# Patient Record
Sex: Male | Born: 1945 | Race: White | Hispanic: No | Marital: Married | Smoking: Former smoker
Health system: Southern US, Community
[De-identification: ages and names within clinical notes are randomized; demographics above are authoritative.]

## PROBLEM LIST (undated history)

## (undated) DIAGNOSIS — I10 Essential (primary) hypertension: Secondary | ICD-10-CM

## (undated) DIAGNOSIS — C73 Malignant neoplasm of thyroid gland: Secondary | ICD-10-CM

## (undated) DIAGNOSIS — E119 Type 2 diabetes mellitus without complications: Secondary | ICD-10-CM

## (undated) HISTORY — PX: THYROIDECTOMY: SHX17

## (undated) HISTORY — PX: HERNIA REPAIR: SHX51

---

## 2008-08-07 ENCOUNTER — Ambulatory Visit: Payer: Self-pay | Admitting: Internal Medicine

## 2008-08-24 ENCOUNTER — Ambulatory Visit: Payer: Self-pay | Admitting: Internal Medicine

## 2009-09-12 ENCOUNTER — Ambulatory Visit: Payer: Self-pay

## 2009-10-04 ENCOUNTER — Ambulatory Visit: Payer: Self-pay | Admitting: Unknown Physician Specialty

## 2009-10-08 ENCOUNTER — Ambulatory Visit: Payer: Self-pay | Admitting: Unknown Physician Specialty

## 2010-05-30 ENCOUNTER — Ambulatory Visit: Payer: Self-pay | Admitting: Internal Medicine

## 2010-06-11 ENCOUNTER — Ambulatory Visit: Payer: Self-pay | Admitting: Surgery

## 2010-06-18 ENCOUNTER — Ambulatory Visit: Payer: Self-pay | Admitting: Surgery

## 2010-07-02 ENCOUNTER — Ambulatory Visit: Payer: Self-pay | Admitting: Surgery

## 2010-07-08 LAB — PATHOLOGY REPORT

## 2010-07-12 ENCOUNTER — Ambulatory Visit: Payer: Self-pay | Admitting: Oncology

## 2010-07-19 ENCOUNTER — Ambulatory Visit: Payer: Self-pay | Admitting: Oncology

## 2010-08-04 ENCOUNTER — Ambulatory Visit: Payer: Self-pay | Admitting: Oncology

## 2010-09-04 ENCOUNTER — Ambulatory Visit: Payer: Self-pay | Admitting: Oncology

## 2010-10-03 ENCOUNTER — Ambulatory Visit: Payer: Self-pay | Admitting: Oncology

## 2010-10-03 ENCOUNTER — Ambulatory Visit: Payer: Self-pay

## 2010-11-07 ENCOUNTER — Ambulatory Visit: Payer: Self-pay | Admitting: Oncology

## 2010-12-03 ENCOUNTER — Ambulatory Visit: Payer: Self-pay | Admitting: Oncology

## 2011-01-09 ENCOUNTER — Ambulatory Visit: Payer: Self-pay | Admitting: Unknown Physician Specialty

## 2011-02-17 ENCOUNTER — Ambulatory Visit: Payer: Self-pay | Admitting: Unknown Physician Specialty

## 2011-02-20 LAB — PATHOLOGY REPORT

## 2011-09-15 ENCOUNTER — Ambulatory Visit: Payer: Self-pay | Admitting: Oncology

## 2011-09-15 LAB — CBC CANCER CENTER
Basophil %: 0.2 %
Eosinophil #: 0.1 x10 3/mm (ref 0.0–0.7)
Eosinophil %: 0.9 %
HCT: 42.9 % (ref 40.0–52.0)
HGB: 14.8 g/dL (ref 13.0–18.0)
MCHC: 34.5 g/dL (ref 32.0–36.0)
MCV: 90 fL (ref 80–100)
Monocyte %: 8.9 %
Platelet: 192 x10 3/mm (ref 150–440)
RBC: 4.78 10*6/uL (ref 4.40–5.90)
WBC: 6.8 x10 3/mm (ref 3.8–10.6)

## 2011-09-15 LAB — COMPREHENSIVE METABOLIC PANEL
Anion Gap: 10 (ref 7–16)
Chloride: 99 mmol/L (ref 98–107)
Co2: 29 mmol/L (ref 21–32)
EGFR (African American): >60
EGFR (Non-African Amer.): >60
Potassium: 4.2 mmol/L (ref 3.5–5.1)
SGOT(AST): 5 U/L — ABNORMAL LOW (ref 15–37)
Sodium: 138 mmol/L (ref 136–145)
Total Protein: 7.1 g/dL (ref 6.4–8.2)

## 2011-09-15 LAB — TSH: Thyroid Stimulating Horm: 15.1 u[IU]/mL — ABNORMAL HIGH

## 2011-10-03 ENCOUNTER — Ambulatory Visit: Payer: Self-pay | Admitting: Oncology

## 2011-11-19 ENCOUNTER — Ambulatory Visit: Payer: Self-pay

## 2011-11-19 LAB — DOT URINE DIP
Blood: NEGATIVE
Protein: NEGATIVE
Specific Gravity: 1.025 (ref 1.003–1.030)

## 2012-03-16 ENCOUNTER — Ambulatory Visit: Payer: Self-pay | Admitting: Oncology

## 2012-09-26 ENCOUNTER — Emergency Department: Payer: Self-pay | Admitting: Emergency Medicine

## 2012-09-26 LAB — CBC
MCH: 30 pg (ref 26.0–34.0)
MCV: 88 fL (ref 80–100)
RBC: 5.09 10*6/uL (ref 4.40–5.90)
RDW: 13.2 % (ref 11.5–14.5)
WBC: 7.5 10*3/uL (ref 3.8–10.6)

## 2012-09-26 LAB — COMPREHENSIVE METABOLIC PANEL
Albumin: 3.6 g/dL (ref 3.4–5.0)
Anion Gap: 9 (ref 7–16)
BUN: 18 mg/dL (ref 7–18)
Chloride: 106 mmol/L (ref 98–107)
Co2: 23 mmol/L (ref 21–32)
Creatinine: 0.96 mg/dL (ref 0.60–1.30)
EGFR (African American): >60
Potassium: 3.4 mmol/L — ABNORMAL LOW (ref 3.5–5.1)
SGOT(AST): 20 U/L (ref 15–37)
Sodium: 138 mmol/L (ref 136–145)

## 2012-09-26 LAB — URINALYSIS, COMPLETE
Bacteria: NONE SEEN
Glucose,UR: >=500 mg/dL (ref 0–75)
Nitrite: NEGATIVE
Protein: NEGATIVE
Specific Gravity: 1.025 (ref 1.003–1.030)
Squamous Epithelial: <1
WBC UR: 1 /HPF (ref 0–5)

## 2012-09-26 LAB — CK TOTAL AND CKMB (NOT AT ARMC)
CK, Total: 63 U/L (ref 35–232)
CK-MB: 0.6 ng/mL (ref 0.5–3.6)

## 2012-09-26 LAB — TROPONIN I: Troponin-I: <0.02 ng/mL

## 2012-10-02 LAB — CULTURE, BLOOD (SINGLE)

## 2014-09-03 ENCOUNTER — Ambulatory Visit: Payer: Self-pay | Admitting: Emergency Medicine

## 2014-09-03 LAB — DOT URINE DIP
BLOOD: NEGATIVE
GLUCOSE, UR: NEGATIVE
PROTEIN: NEGATIVE
Specific Gravity: 1.025 (ref 1.000–1.030)

## 2015-08-21 DIAGNOSIS — J019 Acute sinusitis, unspecified: Secondary | ICD-10-CM | POA: Diagnosis not present

## 2015-09-03 DIAGNOSIS — E039 Hypothyroidism, unspecified: Secondary | ICD-10-CM | POA: Diagnosis not present

## 2015-09-03 DIAGNOSIS — E119 Type 2 diabetes mellitus without complications: Secondary | ICD-10-CM | POA: Diagnosis not present

## 2015-09-03 DIAGNOSIS — E785 Hyperlipidemia, unspecified: Secondary | ICD-10-CM | POA: Diagnosis not present

## 2015-09-03 DIAGNOSIS — I10 Essential (primary) hypertension: Secondary | ICD-10-CM | POA: Diagnosis not present

## 2015-09-04 ENCOUNTER — Ambulatory Visit
Admission: EM | Admit: 2015-09-04 | Discharge: 2015-09-04 | Disposition: A | Discharge status: Home / Self Care | Payer: PRIVATE HEALTH INSURANCE | Attending: Family Medicine | Admitting: Family Medicine

## 2015-09-04 ENCOUNTER — Encounter: Payer: Self-pay | Admitting: Emergency Medicine

## 2015-09-04 DIAGNOSIS — Z024 Encounter for examination for driving license: Secondary | ICD-10-CM

## 2015-09-04 DIAGNOSIS — E039 Hypothyroidism, unspecified: Secondary | ICD-10-CM | POA: Diagnosis not present

## 2015-09-04 DIAGNOSIS — I10 Essential (primary) hypertension: Secondary | ICD-10-CM | POA: Diagnosis not present

## 2015-09-04 DIAGNOSIS — E785 Hyperlipidemia, unspecified: Secondary | ICD-10-CM | POA: Diagnosis not present

## 2015-09-04 DIAGNOSIS — Z029 Encounter for administrative examinations, unspecified: Secondary | ICD-10-CM

## 2015-09-04 DIAGNOSIS — E119 Type 2 diabetes mellitus without complications: Secondary | ICD-10-CM | POA: Diagnosis not present

## 2015-09-04 HISTORY — DX: Type 2 diabetes mellitus without complications: E11.9

## 2015-09-04 HISTORY — DX: Malignant neoplasm of thyroid gland: C73

## 2015-09-04 HISTORY — DX: Essential (primary) hypertension: I10

## 2015-09-04 LAB — DEPT OF TRANSP DIPSTICK, URINE (ARMC ONLY)
Glucose, UA: NEGATIVE mg/dL
Hgb urine dipstick: NEGATIVE
Protein, ur: NEGATIVE mg/dL
Specific Gravity, Urine: 1.02 (ref 1.005–1.030)

## 2015-09-04 NOTE — ED Notes (Signed)
Patient here for DOT Physical.  

## 2015-09-04 NOTE — ED Provider Notes (Signed)
CSN: [id number]     Arrival date & time 09/04/15  1243 History   First MD Initiated Contact with Patient 09/04/15 1419     Chief Complaint  Patient presents with  . DOT Physical    (Consider location/radiation/quality/duration/timing/severity/associated sxs/prior Treatment) HPI   Patient is here for a DOT physical. He has a history of hypertension that is under control and diabetes mellitus that has been slightly out of control with an A1c of 8 but he has been on a cruise with unlimited eating been on prednisone which may possibly driven his glucose higher. Otherwise he is in good shape past surgical history includes a thyroidectomy for cancer currently on replacement therapy. And he had an inguinal herniorrhaphy  Past Medical History  Diagnosis Date  . Hypertension   . Diabetes mellitus without complication ([place])   . Thyroid cancer [facility])    Past Surgical History  Procedure Laterality Date  . Thyroidectomy    . Hernia repair     History reviewed. No pertinent family history. Social History  Substance Use Topics  . Smoking status: Former Research scientist (life sciences)  . Smokeless tobacco: None  . Alcohol Use: No    Review of Systems  All other systems reviewed and are negative.   Allergies  Review of patient's allergies indicates no known allergies.  Home Medications   Prior to Admission medications   Medication Sig Start Date End Date Taking? Authorizing Provider  amLODipine (NORVASC) 5 MG tablet Take 5 mg by mouth daily.   Yes Historical Provider, MD  atorvastatin (LIPITOR) 10 MG tablet Take 10 mg by mouth daily.   Yes Historical Provider, MD  levothyroxine (SYNTHROID, LEVOTHROID) 125 MCG tablet Take 125 mcg by mouth daily before breakfast.   Yes Historical Provider, MD  metFORMIN (GLUCOPHAGE) 500 MG tablet Take 500 mg by mouth 2 (two) times daily with a meal.   Yes Historical Provider, MD   Meds Ordered and Administered this Visit  Medications - No data to display  BP 128/78 mmHg  Pulse  95  Temp(Src) 97.4 F (36.3 C) (Tympanic)  Resp 16  Ht 5\' 8"  (1.727 m)  Wt 232 lb 8 oz (105.461 kg)  BMI 35.36 kg/m2  SpO2 98% No data found.   Physical Exam  Constitutional:  Refer to the DOT physical  Nursing note and vitals reviewed.   ED Course  Procedures (including critical care time)  Labs Review Labs Reviewed  DEPT OF TRANSP DIPSTICK, URINE([facility] ONLY)    Imaging Review No results found.   Visual Acuity Review  Right Eye Distance: 20/20 uncorrected Left Eye Distance: 20/20 uncorrected Bilateral Distance: 20/20 uncorrected  Right Eye Near:   Left Eye Near:    Bilateral Near:         MDM   1. Driver's permit physical examination        Lorin Picket, PA-C 09/04/15 1504

## 2015-10-22 DIAGNOSIS — K529 Noninfective gastroenteritis and colitis, unspecified: Secondary | ICD-10-CM | POA: Diagnosis not present

## 2015-10-22 DIAGNOSIS — K2990 Gastroduodenitis, unspecified, without bleeding: Secondary | ICD-10-CM | POA: Diagnosis not present

## 2015-12-04 DIAGNOSIS — R1084 Generalized abdominal pain: Secondary | ICD-10-CM | POA: Diagnosis not present

## 2015-12-04 DIAGNOSIS — R112 Nausea with vomiting, unspecified: Secondary | ICD-10-CM | POA: Diagnosis not present

## 2015-12-04 DIAGNOSIS — R5383 Other fatigue: Secondary | ICD-10-CM | POA: Diagnosis not present

## 2015-12-04 DIAGNOSIS — R197 Diarrhea, unspecified: Secondary | ICD-10-CM | POA: Diagnosis not present

## 2015-12-04 DIAGNOSIS — R10814 Left lower quadrant abdominal tenderness: Secondary | ICD-10-CM | POA: Diagnosis not present

## 2015-12-04 DIAGNOSIS — R61 Generalized hyperhidrosis: Secondary | ICD-10-CM | POA: Diagnosis not present

## 2015-12-11 ENCOUNTER — Other Ambulatory Visit: Payer: Self-pay | Admitting: Nurse Practitioner

## 2015-12-11 DIAGNOSIS — R1084 Generalized abdominal pain: Secondary | ICD-10-CM

## 2015-12-11 DIAGNOSIS — R1013 Epigastric pain: Secondary | ICD-10-CM | POA: Diagnosis not present

## 2015-12-11 DIAGNOSIS — R0602 Shortness of breath: Secondary | ICD-10-CM | POA: Diagnosis not present

## 2015-12-11 DIAGNOSIS — R61 Generalized hyperhidrosis: Secondary | ICD-10-CM | POA: Diagnosis not present

## 2015-12-17 ENCOUNTER — Ambulatory Visit: Payer: 59

## 2015-12-18 ENCOUNTER — Ambulatory Visit
Admission: RE | Admit: 2015-12-18 | Discharge: 2015-12-18 | Disposition: A | Discharge status: Home / Self Care | Payer: 59 | Source: Ambulatory Visit | Attending: Nurse Practitioner | Admitting: Nurse Practitioner

## 2015-12-18 DIAGNOSIS — R1084 Generalized abdominal pain: Secondary | ICD-10-CM | POA: Diagnosis not present

## 2015-12-18 DIAGNOSIS — R634 Abnormal weight loss: Secondary | ICD-10-CM | POA: Diagnosis not present

## 2015-12-18 DIAGNOSIS — K4020 Bilateral inguinal hernia, without obstruction or gangrene, not specified as recurrent: Secondary | ICD-10-CM | POA: Diagnosis not present

## 2015-12-18 DIAGNOSIS — K5730 Diverticulosis of large intestine without perforation or abscess without bleeding: Secondary | ICD-10-CM | POA: Insufficient documentation

## 2015-12-18 MED ORDER — IOPAMIDOL (ISOVUE-300) INJECTION 61%: 100.0000 mL | Freq: Once | INTRAVENOUS | Status: DC | PRN

## 2015-12-21 DIAGNOSIS — R1011 Right upper quadrant pain: Secondary | ICD-10-CM | POA: Diagnosis not present

## 2015-12-21 DIAGNOSIS — R42 Dizziness and giddiness: Secondary | ICD-10-CM | POA: Diagnosis not present

## 2015-12-21 DIAGNOSIS — Z86010 Personal history of colonic polyps: Secondary | ICD-10-CM | POA: Diagnosis not present

## 2015-12-21 DIAGNOSIS — N3289 Other specified disorders of bladder: Secondary | ICD-10-CM | POA: Diagnosis not present

## 2016-01-01 DIAGNOSIS — R531 Weakness: Secondary | ICD-10-CM | POA: Diagnosis not present

## 2016-01-01 DIAGNOSIS — R634 Abnormal weight loss: Secondary | ICD-10-CM | POA: Diagnosis not present

## 2016-01-07 DIAGNOSIS — R42 Dizziness and giddiness: Secondary | ICD-10-CM | POA: Diagnosis not present

## 2016-01-18 DIAGNOSIS — I491 Atrial premature depolarization: Secondary | ICD-10-CM | POA: Diagnosis not present

## 2016-02-04 DIAGNOSIS — R42 Dizziness and giddiness: Secondary | ICD-10-CM | POA: Diagnosis not present

## 2016-02-04 DIAGNOSIS — I10 Essential (primary) hypertension: Secondary | ICD-10-CM | POA: Diagnosis not present

## 2016-02-04 DIAGNOSIS — E039 Hypothyroidism, unspecified: Secondary | ICD-10-CM | POA: Diagnosis not present

## 2016-02-04 DIAGNOSIS — E785 Hyperlipidemia, unspecified: Secondary | ICD-10-CM | POA: Diagnosis not present

## 2016-02-09 DIAGNOSIS — R531 Weakness: Secondary | ICD-10-CM | POA: Diagnosis not present

## 2016-02-09 DIAGNOSIS — C711 Malignant neoplasm of frontal lobe: Secondary | ICD-10-CM | POA: Diagnosis not present

## 2016-02-09 DIAGNOSIS — Z86010 Personal history of colonic polyps: Secondary | ICD-10-CM | POA: Diagnosis not present

## 2016-02-09 DIAGNOSIS — R42 Dizziness and giddiness: Secondary | ICD-10-CM | POA: Diagnosis not present

## 2016-02-09 DIAGNOSIS — E785 Hyperlipidemia, unspecified: Secondary | ICD-10-CM | POA: Diagnosis not present

## 2016-02-09 DIAGNOSIS — R29898 Other symptoms and signs involving the musculoskeletal system: Secondary | ICD-10-CM | POA: Diagnosis not present

## 2016-02-09 DIAGNOSIS — I10 Essential (primary) hypertension: Secondary | ICD-10-CM | POA: Diagnosis not present

## 2016-02-09 DIAGNOSIS — Z85850 Personal history of malignant neoplasm of thyroid: Secondary | ICD-10-CM | POA: Diagnosis not present

## 2016-02-09 DIAGNOSIS — Z87891 Personal history of nicotine dependence: Secondary | ICD-10-CM | POA: Diagnosis not present

## 2016-02-09 DIAGNOSIS — G939 Disorder of brain, unspecified: Secondary | ICD-10-CM | POA: Diagnosis not present

## 2016-02-09 DIAGNOSIS — R51 Headache: Secondary | ICD-10-CM | POA: Diagnosis not present

## 2016-02-09 DIAGNOSIS — K219 Gastro-esophageal reflux disease without esophagitis: Secondary | ICD-10-CM | POA: Diagnosis not present

## 2016-02-09 DIAGNOSIS — D496 Neoplasm of unspecified behavior of brain: Secondary | ICD-10-CM | POA: Diagnosis not present

## 2016-02-09 DIAGNOSIS — R197 Diarrhea, unspecified: Secondary | ICD-10-CM | POA: Diagnosis not present

## 2016-02-09 DIAGNOSIS — C718 Malignant neoplasm of overlapping sites of brain: Secondary | ICD-10-CM | POA: Diagnosis not present

## 2016-02-09 DIAGNOSIS — G9389 Other specified disorders of brain: Secondary | ICD-10-CM | POA: Diagnosis not present

## 2016-02-09 DIAGNOSIS — E119 Type 2 diabetes mellitus without complications: Secondary | ICD-10-CM | POA: Diagnosis not present

## 2016-02-09 DIAGNOSIS — C729 Malignant neoplasm of central nervous system, unspecified: Secondary | ICD-10-CM | POA: Diagnosis not present

## 2016-02-15 ENCOUNTER — Encounter: Admission: RE | Payer: Self-pay | Source: Ambulatory Visit

## 2016-02-15 ENCOUNTER — Ambulatory Visit: Admission: RE | Admit: 2016-02-15 | Payer: 59 | Source: Ambulatory Visit | Admitting: Unknown Physician Specialty

## 2016-02-15 DIAGNOSIS — D496 Neoplasm of unspecified behavior of brain: Secondary | ICD-10-CM | POA: Diagnosis not present

## 2016-02-15 DIAGNOSIS — C712 Malignant neoplasm of temporal lobe: Secondary | ICD-10-CM | POA: Diagnosis not present

## 2016-02-15 SURGERY — COLONOSCOPY WITH PROPOFOL
Anesthesia: General

## 2016-02-18 DIAGNOSIS — I10 Essential (primary) hypertension: Secondary | ICD-10-CM | POA: Diagnosis not present

## 2016-02-18 DIAGNOSIS — Z79899 Other long term (current) drug therapy: Secondary | ICD-10-CM | POA: Diagnosis not present

## 2016-02-18 DIAGNOSIS — Z87891 Personal history of nicotine dependence: Secondary | ICD-10-CM | POA: Diagnosis not present

## 2016-02-18 DIAGNOSIS — Z7984 Long term (current) use of oral hypoglycemic drugs: Secondary | ICD-10-CM | POA: Diagnosis not present

## 2016-02-18 DIAGNOSIS — E119 Type 2 diabetes mellitus without complications: Secondary | ICD-10-CM | POA: Diagnosis not present

## 2016-02-18 DIAGNOSIS — C718 Malignant neoplasm of overlapping sites of brain: Secondary | ICD-10-CM | POA: Diagnosis not present

## 2016-02-18 DIAGNOSIS — Z885 Allergy status to narcotic agent status: Secondary | ICD-10-CM | POA: Diagnosis not present

## 2016-02-18 DIAGNOSIS — Z5112 Encounter for antineoplastic immunotherapy: Secondary | ICD-10-CM | POA: Diagnosis not present

## 2016-02-18 DIAGNOSIS — C719 Malignant neoplasm of brain, unspecified: Secondary | ICD-10-CM | POA: Diagnosis not present

## 2016-02-18 DIAGNOSIS — Z85850 Personal history of malignant neoplasm of thyroid: Secondary | ICD-10-CM | POA: Diagnosis not present

## 2016-02-19 DIAGNOSIS — C719 Malignant neoplasm of brain, unspecified: Secondary | ICD-10-CM | POA: Diagnosis not present

## 2016-02-22 DIAGNOSIS — C719 Malignant neoplasm of brain, unspecified: Secondary | ICD-10-CM | POA: Diagnosis not present

## 2016-02-26 DIAGNOSIS — C719 Malignant neoplasm of brain, unspecified: Secondary | ICD-10-CM | POA: Diagnosis not present

## 2016-02-27 DIAGNOSIS — C719 Malignant neoplasm of brain, unspecified: Secondary | ICD-10-CM | POA: Diagnosis not present

## 2016-02-28 DIAGNOSIS — C719 Malignant neoplasm of brain, unspecified: Secondary | ICD-10-CM | POA: Diagnosis not present

## 2016-02-29 DIAGNOSIS — C719 Malignant neoplasm of brain, unspecified: Secondary | ICD-10-CM | POA: Diagnosis not present

## 2016-03-03 DIAGNOSIS — C719 Malignant neoplasm of brain, unspecified: Secondary | ICD-10-CM | POA: Diagnosis not present

## 2016-03-04 DIAGNOSIS — C719 Malignant neoplasm of brain, unspecified: Secondary | ICD-10-CM | POA: Diagnosis not present

## 2016-03-05 DIAGNOSIS — C719 Malignant neoplasm of brain, unspecified: Secondary | ICD-10-CM | POA: Diagnosis not present

## 2016-03-06 DIAGNOSIS — C712 Malignant neoplasm of temporal lobe: Secondary | ICD-10-CM | POA: Diagnosis not present

## 2016-03-06 DIAGNOSIS — Z79899 Other long term (current) drug therapy: Secondary | ICD-10-CM | POA: Diagnosis not present

## 2016-03-06 DIAGNOSIS — C719 Malignant neoplasm of brain, unspecified: Secondary | ICD-10-CM | POA: Diagnosis not present

## 2016-03-06 DIAGNOSIS — E1165 Type 2 diabetes mellitus with hyperglycemia: Secondary | ICD-10-CM | POA: Diagnosis not present

## 2016-03-06 DIAGNOSIS — Z7984 Long term (current) use of oral hypoglycemic drugs: Secondary | ICD-10-CM | POA: Diagnosis not present

## 2016-03-06 DIAGNOSIS — Z87891 Personal history of nicotine dependence: Secondary | ICD-10-CM | POA: Diagnosis not present

## 2016-03-06 DIAGNOSIS — E119 Type 2 diabetes mellitus without complications: Secondary | ICD-10-CM | POA: Diagnosis not present

## 2016-03-06 DIAGNOSIS — R7309 Other abnormal glucose: Secondary | ICD-10-CM | POA: Diagnosis not present

## 2016-03-06 DIAGNOSIS — Z5112 Encounter for antineoplastic immunotherapy: Secondary | ICD-10-CM | POA: Diagnosis not present

## 2016-03-06 DIAGNOSIS — C718 Malignant neoplasm of overlapping sites of brain: Secondary | ICD-10-CM | POA: Diagnosis not present

## 2016-03-07 DIAGNOSIS — C719 Malignant neoplasm of brain, unspecified: Secondary | ICD-10-CM | POA: Diagnosis not present

## 2016-03-10 DIAGNOSIS — C719 Malignant neoplasm of brain, unspecified: Secondary | ICD-10-CM | POA: Diagnosis not present

## 2016-03-11 DIAGNOSIS — C719 Malignant neoplasm of brain, unspecified: Secondary | ICD-10-CM | POA: Diagnosis not present

## 2016-03-12 DIAGNOSIS — C719 Malignant neoplasm of brain, unspecified: Secondary | ICD-10-CM | POA: Diagnosis not present

## 2016-03-13 DIAGNOSIS — C719 Malignant neoplasm of brain, unspecified: Secondary | ICD-10-CM | POA: Diagnosis not present

## 2016-03-14 DIAGNOSIS — C719 Malignant neoplasm of brain, unspecified: Secondary | ICD-10-CM | POA: Diagnosis not present

## 2016-03-17 DIAGNOSIS — C719 Malignant neoplasm of brain, unspecified: Secondary | ICD-10-CM | POA: Diagnosis not present

## 2016-03-18 DIAGNOSIS — C719 Malignant neoplasm of brain, unspecified: Secondary | ICD-10-CM | POA: Diagnosis not present

## 2016-03-19 DIAGNOSIS — E1165 Type 2 diabetes mellitus with hyperglycemia: Secondary | ICD-10-CM | POA: Diagnosis not present

## 2016-03-19 DIAGNOSIS — Z5181 Encounter for therapeutic drug level monitoring: Secondary | ICD-10-CM | POA: Diagnosis not present

## 2016-03-19 DIAGNOSIS — C719 Malignant neoplasm of brain, unspecified: Secondary | ICD-10-CM | POA: Diagnosis not present

## 2016-03-19 DIAGNOSIS — Z7984 Long term (current) use of oral hypoglycemic drugs: Secondary | ICD-10-CM | POA: Diagnosis not present

## 2016-03-19 DIAGNOSIS — Z5111 Encounter for antineoplastic chemotherapy: Secondary | ICD-10-CM | POA: Diagnosis not present

## 2016-03-19 DIAGNOSIS — C718 Malignant neoplasm of overlapping sites of brain: Secondary | ICD-10-CM | POA: Diagnosis not present

## 2016-03-19 DIAGNOSIS — R81 Glycosuria: Secondary | ICD-10-CM | POA: Diagnosis not present

## 2016-03-19 DIAGNOSIS — C712 Malignant neoplasm of temporal lobe: Secondary | ICD-10-CM | POA: Diagnosis not present

## 2016-03-19 DIAGNOSIS — R531 Weakness: Secondary | ICD-10-CM | POA: Diagnosis not present

## 2016-03-24 DIAGNOSIS — C719 Malignant neoplasm of brain, unspecified: Secondary | ICD-10-CM | POA: Diagnosis not present

## 2016-03-25 DIAGNOSIS — C719 Malignant neoplasm of brain, unspecified: Secondary | ICD-10-CM | POA: Diagnosis not present

## 2016-03-26 DIAGNOSIS — C719 Malignant neoplasm of brain, unspecified: Secondary | ICD-10-CM | POA: Diagnosis not present

## 2016-03-27 DIAGNOSIS — C719 Malignant neoplasm of brain, unspecified: Secondary | ICD-10-CM | POA: Diagnosis not present

## 2016-03-28 DIAGNOSIS — C719 Malignant neoplasm of brain, unspecified: Secondary | ICD-10-CM | POA: Diagnosis not present

## 2016-03-28 DIAGNOSIS — C712 Malignant neoplasm of temporal lobe: Secondary | ICD-10-CM | POA: Diagnosis not present

## 2016-03-31 DIAGNOSIS — C719 Malignant neoplasm of brain, unspecified: Secondary | ICD-10-CM | POA: Diagnosis not present

## 2016-04-01 DIAGNOSIS — C719 Malignant neoplasm of brain, unspecified: Secondary | ICD-10-CM | POA: Diagnosis not present

## 2016-04-02 DIAGNOSIS — C719 Malignant neoplasm of brain, unspecified: Secondary | ICD-10-CM | POA: Diagnosis not present

## 2016-04-03 DIAGNOSIS — Z79899 Other long term (current) drug therapy: Secondary | ICD-10-CM | POA: Diagnosis not present

## 2016-04-03 DIAGNOSIS — C719 Malignant neoplasm of brain, unspecified: Secondary | ICD-10-CM | POA: Diagnosis not present

## 2016-04-03 DIAGNOSIS — C718 Malignant neoplasm of overlapping sites of brain: Secondary | ICD-10-CM | POA: Diagnosis not present

## 2016-04-03 DIAGNOSIS — C712 Malignant neoplasm of temporal lobe: Secondary | ICD-10-CM | POA: Diagnosis not present

## 2016-04-03 DIAGNOSIS — Z5112 Encounter for antineoplastic immunotherapy: Secondary | ICD-10-CM | POA: Diagnosis not present

## 2016-04-03 DIAGNOSIS — E1165 Type 2 diabetes mellitus with hyperglycemia: Secondary | ICD-10-CM | POA: Diagnosis not present

## 2016-04-04 DIAGNOSIS — C719 Malignant neoplasm of brain, unspecified: Secondary | ICD-10-CM | POA: Diagnosis not present

## 2016-04-04 DIAGNOSIS — C712 Malignant neoplasm of temporal lobe: Secondary | ICD-10-CM | POA: Diagnosis not present

## 2016-04-08 DIAGNOSIS — C719 Malignant neoplasm of brain, unspecified: Secondary | ICD-10-CM | POA: Diagnosis not present

## 2016-04-08 DIAGNOSIS — C712 Malignant neoplasm of temporal lobe: Secondary | ICD-10-CM | POA: Diagnosis not present

## 2016-04-09 DIAGNOSIS — C719 Malignant neoplasm of brain, unspecified: Secondary | ICD-10-CM | POA: Diagnosis not present

## 2016-04-09 DIAGNOSIS — C712 Malignant neoplasm of temporal lobe: Secondary | ICD-10-CM | POA: Diagnosis not present

## 2016-04-10 DIAGNOSIS — C719 Malignant neoplasm of brain, unspecified: Secondary | ICD-10-CM | POA: Diagnosis not present

## 2016-04-10 DIAGNOSIS — C712 Malignant neoplasm of temporal lobe: Secondary | ICD-10-CM | POA: Diagnosis not present

## 2016-04-11 DIAGNOSIS — C719 Malignant neoplasm of brain, unspecified: Secondary | ICD-10-CM | POA: Diagnosis not present

## 2016-04-11 DIAGNOSIS — C712 Malignant neoplasm of temporal lobe: Secondary | ICD-10-CM | POA: Diagnosis not present

## 2016-04-14 DIAGNOSIS — C719 Malignant neoplasm of brain, unspecified: Secondary | ICD-10-CM | POA: Diagnosis not present

## 2016-04-14 DIAGNOSIS — C712 Malignant neoplasm of temporal lobe: Secondary | ICD-10-CM | POA: Diagnosis not present

## 2016-04-15 DIAGNOSIS — C712 Malignant neoplasm of temporal lobe: Secondary | ICD-10-CM | POA: Diagnosis not present

## 2016-04-15 DIAGNOSIS — C719 Malignant neoplasm of brain, unspecified: Secondary | ICD-10-CM | POA: Diagnosis not present

## 2016-04-17 DIAGNOSIS — Z5112 Encounter for antineoplastic immunotherapy: Secondary | ICD-10-CM | POA: Diagnosis not present

## 2016-04-17 DIAGNOSIS — C712 Malignant neoplasm of temporal lobe: Secondary | ICD-10-CM | POA: Diagnosis not present

## 2016-05-02 DIAGNOSIS — E119 Type 2 diabetes mellitus without complications: Secondary | ICD-10-CM | POA: Diagnosis not present

## 2016-05-02 DIAGNOSIS — E785 Hyperlipidemia, unspecified: Secondary | ICD-10-CM | POA: Diagnosis not present

## 2016-05-02 DIAGNOSIS — I10 Essential (primary) hypertension: Secondary | ICD-10-CM | POA: Diagnosis not present

## 2016-05-05 DIAGNOSIS — Z23 Encounter for immunization: Secondary | ICD-10-CM | POA: Diagnosis not present

## 2016-05-05 DIAGNOSIS — E119 Type 2 diabetes mellitus without complications: Secondary | ICD-10-CM | POA: Diagnosis not present

## 2016-05-06 DIAGNOSIS — C712 Malignant neoplasm of temporal lobe: Secondary | ICD-10-CM | POA: Diagnosis not present

## 2016-05-07 DIAGNOSIS — C718 Malignant neoplasm of overlapping sites of brain: Secondary | ICD-10-CM | POA: Diagnosis not present

## 2016-05-07 DIAGNOSIS — R29810 Facial weakness: Secondary | ICD-10-CM | POA: Diagnosis not present

## 2016-05-07 DIAGNOSIS — Z5112 Encounter for antineoplastic immunotherapy: Secondary | ICD-10-CM | POA: Diagnosis not present

## 2016-05-07 DIAGNOSIS — R8290 Unspecified abnormal findings in urine: Secondary | ICD-10-CM | POA: Diagnosis not present

## 2016-05-07 DIAGNOSIS — R400 Somnolence: Secondary | ICD-10-CM | POA: Diagnosis not present

## 2016-05-07 DIAGNOSIS — R531 Weakness: Secondary | ICD-10-CM | POA: Diagnosis not present

## 2016-05-07 DIAGNOSIS — C712 Malignant neoplasm of temporal lobe: Secondary | ICD-10-CM | POA: Diagnosis not present

## 2016-05-07 DIAGNOSIS — R51 Headache: Secondary | ICD-10-CM | POA: Diagnosis not present

## 2016-05-07 DIAGNOSIS — R490 Dysphonia: Secondary | ICD-10-CM | POA: Diagnosis not present

## 2016-05-07 DIAGNOSIS — R5383 Other fatigue: Secondary | ICD-10-CM | POA: Diagnosis not present

## 2016-05-07 DIAGNOSIS — R630 Anorexia: Secondary | ICD-10-CM | POA: Diagnosis not present

## 2016-05-12 DIAGNOSIS — R5383 Other fatigue: Secondary | ICD-10-CM | POA: Diagnosis not present

## 2016-05-12 DIAGNOSIS — C712 Malignant neoplasm of temporal lobe: Secondary | ICD-10-CM | POA: Diagnosis not present

## 2016-05-16 DIAGNOSIS — I2692 Saddle embolus of pulmonary artery without acute cor pulmonale: Secondary | ICD-10-CM | POA: Diagnosis not present

## 2016-05-16 DIAGNOSIS — I82431 Acute embolism and thrombosis of right popliteal vein: Secondary | ICD-10-CM | POA: Diagnosis not present

## 2016-05-16 DIAGNOSIS — R918 Other nonspecific abnormal finding of lung field: Secondary | ICD-10-CM | POA: Diagnosis not present

## 2016-05-16 DIAGNOSIS — R4182 Altered mental status, unspecified: Secondary | ICD-10-CM | POA: Diagnosis not present

## 2016-05-16 DIAGNOSIS — I82411 Acute embolism and thrombosis of right femoral vein: Secondary | ICD-10-CM | POA: Diagnosis not present

## 2016-05-16 DIAGNOSIS — G9340 Encephalopathy, unspecified: Secondary | ICD-10-CM | POA: Diagnosis not present

## 2016-05-16 DIAGNOSIS — R0902 Hypoxemia: Secondary | ICD-10-CM | POA: Diagnosis not present

## 2016-05-16 DIAGNOSIS — E890 Postprocedural hypothyroidism: Secondary | ICD-10-CM | POA: Diagnosis not present

## 2016-05-16 DIAGNOSIS — I10 Essential (primary) hypertension: Secondary | ICD-10-CM | POA: Diagnosis not present

## 2016-05-16 DIAGNOSIS — R531 Weakness: Secondary | ICD-10-CM | POA: Diagnosis not present

## 2016-05-16 DIAGNOSIS — I2699 Other pulmonary embolism without acute cor pulmonale: Secondary | ICD-10-CM | POA: Diagnosis not present

## 2016-05-16 DIAGNOSIS — I82401 Acute embolism and thrombosis of unspecified deep veins of right lower extremity: Secondary | ICD-10-CM | POA: Diagnosis not present

## 2016-05-16 DIAGNOSIS — R569 Unspecified convulsions: Secondary | ICD-10-CM | POA: Diagnosis not present

## 2016-05-16 DIAGNOSIS — W19XXXA Unspecified fall, initial encounter: Secondary | ICD-10-CM | POA: Diagnosis not present

## 2016-05-16 DIAGNOSIS — I2602 Saddle embolus of pulmonary artery with acute cor pulmonale: Secondary | ICD-10-CM | POA: Diagnosis not present

## 2016-05-16 DIAGNOSIS — J45909 Unspecified asthma, uncomplicated: Secondary | ICD-10-CM | POA: Diagnosis not present

## 2016-05-16 DIAGNOSIS — I5189 Other ill-defined heart diseases: Secondary | ICD-10-CM | POA: Diagnosis not present

## 2016-05-16 DIAGNOSIS — E119 Type 2 diabetes mellitus without complications: Secondary | ICD-10-CM | POA: Diagnosis not present

## 2016-05-16 DIAGNOSIS — C712 Malignant neoplasm of temporal lobe: Secondary | ICD-10-CM | POA: Diagnosis not present

## 2016-05-16 DIAGNOSIS — R509 Fever, unspecified: Secondary | ICD-10-CM | POA: Diagnosis not present

## 2016-05-23 DIAGNOSIS — E119 Type 2 diabetes mellitus without complications: Secondary | ICD-10-CM | POA: Diagnosis not present

## 2016-05-23 DIAGNOSIS — C712 Malignant neoplasm of temporal lobe: Secondary | ICD-10-CM | POA: Diagnosis not present

## 2016-05-23 DIAGNOSIS — I2692 Saddle embolus of pulmonary artery without acute cor pulmonale: Secondary | ICD-10-CM | POA: Diagnosis not present

## 2016-05-23 DIAGNOSIS — Z7984 Long term (current) use of oral hypoglycemic drugs: Secondary | ICD-10-CM | POA: Diagnosis not present

## 2016-05-23 DIAGNOSIS — Z9981 Dependence on supplemental oxygen: Secondary | ICD-10-CM | POA: Diagnosis not present

## 2016-05-23 DIAGNOSIS — Z7901 Long term (current) use of anticoagulants: Secondary | ICD-10-CM | POA: Diagnosis not present

## 2016-05-28 DIAGNOSIS — C712 Malignant neoplasm of temporal lobe: Secondary | ICD-10-CM | POA: Diagnosis not present

## 2016-05-28 DIAGNOSIS — R197 Diarrhea, unspecified: Secondary | ICD-10-CM | POA: Diagnosis not present

## 2016-05-28 DIAGNOSIS — R21 Rash and other nonspecific skin eruption: Secondary | ICD-10-CM | POA: Diagnosis not present

## 2016-05-28 DIAGNOSIS — R0902 Hypoxemia: Secondary | ICD-10-CM | POA: Diagnosis not present

## 2016-05-28 DIAGNOSIS — B372 Candidiasis of skin and nail: Secondary | ICD-10-CM | POA: Diagnosis not present

## 2016-05-28 DIAGNOSIS — Z7901 Long term (current) use of anticoagulants: Secondary | ICD-10-CM | POA: Diagnosis not present

## 2016-05-28 DIAGNOSIS — I2692 Saddle embolus of pulmonary artery without acute cor pulmonale: Secondary | ICD-10-CM | POA: Diagnosis not present

## 2016-05-28 DIAGNOSIS — I2699 Other pulmonary embolism without acute cor pulmonale: Secondary | ICD-10-CM | POA: Diagnosis not present

## 2016-05-29 DIAGNOSIS — Z9981 Dependence on supplemental oxygen: Secondary | ICD-10-CM | POA: Diagnosis not present

## 2016-05-29 DIAGNOSIS — I2692 Saddle embolus of pulmonary artery without acute cor pulmonale: Secondary | ICD-10-CM | POA: Diagnosis not present

## 2016-05-29 DIAGNOSIS — Z7984 Long term (current) use of oral hypoglycemic drugs: Secondary | ICD-10-CM | POA: Diagnosis not present

## 2016-05-29 DIAGNOSIS — E119 Type 2 diabetes mellitus without complications: Secondary | ICD-10-CM | POA: Diagnosis not present

## 2016-05-29 DIAGNOSIS — Z7901 Long term (current) use of anticoagulants: Secondary | ICD-10-CM | POA: Diagnosis not present

## 2016-05-29 DIAGNOSIS — C712 Malignant neoplasm of temporal lobe: Secondary | ICD-10-CM | POA: Diagnosis not present

## 2016-05-30 DIAGNOSIS — I2692 Saddle embolus of pulmonary artery without acute cor pulmonale: Secondary | ICD-10-CM | POA: Diagnosis not present

## 2016-05-30 DIAGNOSIS — E119 Type 2 diabetes mellitus without complications: Secondary | ICD-10-CM | POA: Diagnosis not present

## 2016-05-30 DIAGNOSIS — C712 Malignant neoplasm of temporal lobe: Secondary | ICD-10-CM | POA: Diagnosis not present

## 2016-05-30 DIAGNOSIS — Z7984 Long term (current) use of oral hypoglycemic drugs: Secondary | ICD-10-CM | POA: Diagnosis not present

## 2016-05-30 DIAGNOSIS — Z7901 Long term (current) use of anticoagulants: Secondary | ICD-10-CM | POA: Diagnosis not present

## 2016-05-30 DIAGNOSIS — Z9981 Dependence on supplemental oxygen: Secondary | ICD-10-CM | POA: Diagnosis not present

## 2016-05-30 DIAGNOSIS — R197 Diarrhea, unspecified: Secondary | ICD-10-CM | POA: Diagnosis not present

## 2016-05-31 DIAGNOSIS — Z9981 Dependence on supplemental oxygen: Secondary | ICD-10-CM | POA: Diagnosis not present

## 2016-05-31 DIAGNOSIS — I2692 Saddle embolus of pulmonary artery without acute cor pulmonale: Secondary | ICD-10-CM | POA: Diagnosis not present

## 2016-05-31 DIAGNOSIS — Z7901 Long term (current) use of anticoagulants: Secondary | ICD-10-CM | POA: Diagnosis not present

## 2016-05-31 DIAGNOSIS — E119 Type 2 diabetes mellitus without complications: Secondary | ICD-10-CM | POA: Diagnosis not present

## 2016-05-31 DIAGNOSIS — Z7984 Long term (current) use of oral hypoglycemic drugs: Secondary | ICD-10-CM | POA: Diagnosis not present

## 2016-05-31 DIAGNOSIS — C712 Malignant neoplasm of temporal lobe: Secondary | ICD-10-CM | POA: Diagnosis not present

## 2016-06-01 DIAGNOSIS — R0902 Hypoxemia: Secondary | ICD-10-CM | POA: Diagnosis not present

## 2016-06-01 DIAGNOSIS — Z66 Do not resuscitate: Secondary | ICD-10-CM | POA: Diagnosis not present

## 2016-06-01 DIAGNOSIS — R918 Other nonspecific abnormal finding of lung field: Secondary | ICD-10-CM | POA: Diagnosis not present

## 2016-06-01 DIAGNOSIS — K6389 Other specified diseases of intestine: Secondary | ICD-10-CM | POA: Diagnosis not present

## 2016-06-01 DIAGNOSIS — Z1401 Asymptomatic hemophilia A carrier: Secondary | ICD-10-CM | POA: Diagnosis not present

## 2016-06-01 DIAGNOSIS — R0602 Shortness of breath: Secondary | ICD-10-CM | POA: Diagnosis not present

## 2016-06-01 DIAGNOSIS — C718 Malignant neoplasm of overlapping sites of brain: Secondary | ICD-10-CM | POA: Diagnosis not present

## 2016-06-01 DIAGNOSIS — F29 Unspecified psychosis not due to a substance or known physiological condition: Secondary | ICD-10-CM | POA: Diagnosis not present

## 2016-06-01 DIAGNOSIS — R040 Epistaxis: Secondary | ICD-10-CM | POA: Diagnosis not present

## 2016-06-01 DIAGNOSIS — K4020 Bilateral inguinal hernia, without obstruction or gangrene, not specified as recurrent: Secondary | ICD-10-CM | POA: Diagnosis not present

## 2016-06-01 DIAGNOSIS — Z515 Encounter for palliative care: Secondary | ICD-10-CM | POA: Diagnosis not present

## 2016-06-01 DIAGNOSIS — R000 Tachycardia, unspecified: Secondary | ICD-10-CM | POA: Diagnosis not present

## 2016-06-01 DIAGNOSIS — K625 Hemorrhage of anus and rectum: Secondary | ICD-10-CM | POA: Diagnosis not present

## 2016-06-01 DIAGNOSIS — K921 Melena: Secondary | ICD-10-CM | POA: Diagnosis not present

## 2016-06-01 DIAGNOSIS — R569 Unspecified convulsions: Secondary | ICD-10-CM | POA: Diagnosis not present

## 2016-06-01 DIAGNOSIS — G936 Cerebral edema: Secondary | ICD-10-CM | POA: Diagnosis not present

## 2016-06-01 DIAGNOSIS — I2699 Other pulmonary embolism without acute cor pulmonale: Secondary | ICD-10-CM | POA: Diagnosis not present

## 2016-06-01 DIAGNOSIS — J9621 Acute and chronic respiratory failure with hypoxia: Secondary | ICD-10-CM | POA: Diagnosis not present

## 2016-06-01 DIAGNOSIS — C712 Malignant neoplasm of temporal lobe: Secondary | ICD-10-CM | POA: Diagnosis not present

## 2016-06-01 DIAGNOSIS — I959 Hypotension, unspecified: Secondary | ICD-10-CM | POA: Diagnosis not present

## 2016-06-01 DIAGNOSIS — K922 Gastrointestinal hemorrhage, unspecified: Secondary | ICD-10-CM | POA: Diagnosis not present

## 2016-06-04 DIAGNOSIS — J9621 Acute and chronic respiratory failure with hypoxia: Secondary | ICD-10-CM | POA: Diagnosis not present

## 2016-06-04 DIAGNOSIS — E43 Unspecified severe protein-calorie malnutrition: Secondary | ICD-10-CM | POA: Diagnosis not present

## 2016-06-04 DIAGNOSIS — C712 Malignant neoplasm of temporal lobe: Secondary | ICD-10-CM | POA: Diagnosis not present

## 2016-06-04 DIAGNOSIS — K922 Gastrointestinal hemorrhage, unspecified: Secondary | ICD-10-CM | POA: Diagnosis not present

## 2016-06-04 DIAGNOSIS — E8809 Other disorders of plasma-protein metabolism, not elsewhere classified: Secondary | ICD-10-CM | POA: Diagnosis not present

## 2016-06-04 DIAGNOSIS — I2699 Other pulmonary embolism without acute cor pulmonale: Secondary | ICD-10-CM | POA: Diagnosis not present

## 2016-06-04 DIAGNOSIS — D649 Anemia, unspecified: Secondary | ICD-10-CM | POA: Diagnosis not present

## 2016-06-04 DIAGNOSIS — I82401 Acute embolism and thrombosis of unspecified deep veins of right lower extremity: Secondary | ICD-10-CM | POA: Diagnosis not present

## 2016-06-04 DIAGNOSIS — Z9981 Dependence on supplemental oxygen: Secondary | ICD-10-CM | POA: Diagnosis not present

## 2016-06-05 DIAGNOSIS — E8809 Other disorders of plasma-protein metabolism, not elsewhere classified: Secondary | ICD-10-CM | POA: Diagnosis not present

## 2016-06-05 DIAGNOSIS — C712 Malignant neoplasm of temporal lobe: Secondary | ICD-10-CM | POA: Diagnosis not present

## 2016-06-05 DIAGNOSIS — E43 Unspecified severe protein-calorie malnutrition: Secondary | ICD-10-CM | POA: Diagnosis not present

## 2016-06-05 DIAGNOSIS — Z9981 Dependence on supplemental oxygen: Secondary | ICD-10-CM | POA: Diagnosis not present

## 2016-06-05 DIAGNOSIS — I82401 Acute embolism and thrombosis of unspecified deep veins of right lower extremity: Secondary | ICD-10-CM | POA: Diagnosis not present

## 2016-06-05 DIAGNOSIS — J9621 Acute and chronic respiratory failure with hypoxia: Secondary | ICD-10-CM | POA: Diagnosis not present

## 2016-06-05 DIAGNOSIS — K922 Gastrointestinal hemorrhage, unspecified: Secondary | ICD-10-CM | POA: Diagnosis not present

## 2016-06-05 DIAGNOSIS — I2699 Other pulmonary embolism without acute cor pulmonale: Secondary | ICD-10-CM | POA: Diagnosis not present

## 2016-06-05 DIAGNOSIS — D649 Anemia, unspecified: Secondary | ICD-10-CM | POA: Diagnosis not present

## 2016-06-06 DIAGNOSIS — E43 Unspecified severe protein-calorie malnutrition: Secondary | ICD-10-CM | POA: Diagnosis not present

## 2016-06-06 DIAGNOSIS — I2699 Other pulmonary embolism without acute cor pulmonale: Secondary | ICD-10-CM | POA: Diagnosis not present

## 2016-06-06 DIAGNOSIS — I82401 Acute embolism and thrombosis of unspecified deep veins of right lower extremity: Secondary | ICD-10-CM | POA: Diagnosis not present

## 2016-06-06 DIAGNOSIS — J9621 Acute and chronic respiratory failure with hypoxia: Secondary | ICD-10-CM | POA: Diagnosis not present

## 2016-06-06 DIAGNOSIS — Z9981 Dependence on supplemental oxygen: Secondary | ICD-10-CM | POA: Diagnosis not present

## 2016-06-06 DIAGNOSIS — E8809 Other disorders of plasma-protein metabolism, not elsewhere classified: Secondary | ICD-10-CM | POA: Diagnosis not present

## 2016-06-06 DIAGNOSIS — K922 Gastrointestinal hemorrhage, unspecified: Secondary | ICD-10-CM | POA: Diagnosis not present

## 2016-06-06 DIAGNOSIS — D649 Anemia, unspecified: Secondary | ICD-10-CM | POA: Diagnosis not present

## 2016-06-06 DIAGNOSIS — C712 Malignant neoplasm of temporal lobe: Secondary | ICD-10-CM | POA: Diagnosis not present

## 2016-06-07 DIAGNOSIS — Z9981 Dependence on supplemental oxygen: Secondary | ICD-10-CM | POA: Diagnosis not present

## 2016-06-07 DIAGNOSIS — J9621 Acute and chronic respiratory failure with hypoxia: Secondary | ICD-10-CM | POA: Diagnosis not present

## 2016-06-07 DIAGNOSIS — E8809 Other disorders of plasma-protein metabolism, not elsewhere classified: Secondary | ICD-10-CM | POA: Diagnosis not present

## 2016-06-07 DIAGNOSIS — E43 Unspecified severe protein-calorie malnutrition: Secondary | ICD-10-CM | POA: Diagnosis not present

## 2016-06-07 DIAGNOSIS — I82401 Acute embolism and thrombosis of unspecified deep veins of right lower extremity: Secondary | ICD-10-CM | POA: Diagnosis not present

## 2016-06-07 DIAGNOSIS — C712 Malignant neoplasm of temporal lobe: Secondary | ICD-10-CM | POA: Diagnosis not present

## 2016-06-07 DIAGNOSIS — I2699 Other pulmonary embolism without acute cor pulmonale: Secondary | ICD-10-CM | POA: Diagnosis not present

## 2016-06-07 DIAGNOSIS — K922 Gastrointestinal hemorrhage, unspecified: Secondary | ICD-10-CM | POA: Diagnosis not present

## 2016-06-07 DIAGNOSIS — D649 Anemia, unspecified: Secondary | ICD-10-CM | POA: Diagnosis not present

## 2016-06-08 DIAGNOSIS — E43 Unspecified severe protein-calorie malnutrition: Secondary | ICD-10-CM | POA: Diagnosis not present

## 2016-06-08 DIAGNOSIS — E8809 Other disorders of plasma-protein metabolism, not elsewhere classified: Secondary | ICD-10-CM | POA: Diagnosis not present

## 2016-06-08 DIAGNOSIS — I82401 Acute embolism and thrombosis of unspecified deep veins of right lower extremity: Secondary | ICD-10-CM | POA: Diagnosis not present

## 2016-06-08 DIAGNOSIS — K922 Gastrointestinal hemorrhage, unspecified: Secondary | ICD-10-CM | POA: Diagnosis not present

## 2016-06-08 DIAGNOSIS — Z9981 Dependence on supplemental oxygen: Secondary | ICD-10-CM | POA: Diagnosis not present

## 2016-06-08 DIAGNOSIS — C712 Malignant neoplasm of temporal lobe: Secondary | ICD-10-CM | POA: Diagnosis not present

## 2016-06-08 DIAGNOSIS — I2699 Other pulmonary embolism without acute cor pulmonale: Secondary | ICD-10-CM | POA: Diagnosis not present

## 2016-06-08 DIAGNOSIS — J9621 Acute and chronic respiratory failure with hypoxia: Secondary | ICD-10-CM | POA: Diagnosis not present

## 2016-06-08 DIAGNOSIS — D649 Anemia, unspecified: Secondary | ICD-10-CM | POA: Diagnosis not present

## 2016-06-09 DIAGNOSIS — I2699 Other pulmonary embolism without acute cor pulmonale: Secondary | ICD-10-CM | POA: Diagnosis not present

## 2016-06-09 DIAGNOSIS — Z9981 Dependence on supplemental oxygen: Secondary | ICD-10-CM | POA: Diagnosis not present

## 2016-06-09 DIAGNOSIS — I82401 Acute embolism and thrombosis of unspecified deep veins of right lower extremity: Secondary | ICD-10-CM | POA: Diagnosis not present

## 2016-06-09 DIAGNOSIS — D649 Anemia, unspecified: Secondary | ICD-10-CM | POA: Diagnosis not present

## 2016-06-09 DIAGNOSIS — K922 Gastrointestinal hemorrhage, unspecified: Secondary | ICD-10-CM | POA: Diagnosis not present

## 2016-06-09 DIAGNOSIS — C712 Malignant neoplasm of temporal lobe: Secondary | ICD-10-CM | POA: Diagnosis not present

## 2016-06-09 DIAGNOSIS — J9621 Acute and chronic respiratory failure with hypoxia: Secondary | ICD-10-CM | POA: Diagnosis not present

## 2016-06-09 DIAGNOSIS — E43 Unspecified severe protein-calorie malnutrition: Secondary | ICD-10-CM | POA: Diagnosis not present

## 2016-06-09 DIAGNOSIS — E8809 Other disorders of plasma-protein metabolism, not elsewhere classified: Secondary | ICD-10-CM | POA: Diagnosis not present

## 2016-06-10 DIAGNOSIS — I2699 Other pulmonary embolism without acute cor pulmonale: Secondary | ICD-10-CM | POA: Diagnosis not present

## 2016-06-10 DIAGNOSIS — C712 Malignant neoplasm of temporal lobe: Secondary | ICD-10-CM | POA: Diagnosis not present

## 2016-06-10 DIAGNOSIS — D649 Anemia, unspecified: Secondary | ICD-10-CM | POA: Diagnosis not present

## 2016-06-10 DIAGNOSIS — E43 Unspecified severe protein-calorie malnutrition: Secondary | ICD-10-CM | POA: Diagnosis not present

## 2016-06-10 DIAGNOSIS — E8809 Other disorders of plasma-protein metabolism, not elsewhere classified: Secondary | ICD-10-CM | POA: Diagnosis not present

## 2016-06-10 DIAGNOSIS — J9621 Acute and chronic respiratory failure with hypoxia: Secondary | ICD-10-CM | POA: Diagnosis not present

## 2016-06-10 DIAGNOSIS — I82401 Acute embolism and thrombosis of unspecified deep veins of right lower extremity: Secondary | ICD-10-CM | POA: Diagnosis not present

## 2016-06-10 DIAGNOSIS — K922 Gastrointestinal hemorrhage, unspecified: Secondary | ICD-10-CM | POA: Diagnosis not present

## 2016-06-10 DIAGNOSIS — Z9981 Dependence on supplemental oxygen: Secondary | ICD-10-CM | POA: Diagnosis not present

## 2016-06-11 DIAGNOSIS — K922 Gastrointestinal hemorrhage, unspecified: Secondary | ICD-10-CM | POA: Diagnosis not present

## 2016-06-11 DIAGNOSIS — E8809 Other disorders of plasma-protein metabolism, not elsewhere classified: Secondary | ICD-10-CM | POA: Diagnosis not present

## 2016-06-11 DIAGNOSIS — J9621 Acute and chronic respiratory failure with hypoxia: Secondary | ICD-10-CM | POA: Diagnosis not present

## 2016-06-11 DIAGNOSIS — C712 Malignant neoplasm of temporal lobe: Secondary | ICD-10-CM | POA: Diagnosis not present

## 2016-06-11 DIAGNOSIS — D649 Anemia, unspecified: Secondary | ICD-10-CM | POA: Diagnosis not present

## 2016-06-11 DIAGNOSIS — Z9981 Dependence on supplemental oxygen: Secondary | ICD-10-CM | POA: Diagnosis not present

## 2016-06-11 DIAGNOSIS — I82401 Acute embolism and thrombosis of unspecified deep veins of right lower extremity: Secondary | ICD-10-CM | POA: Diagnosis not present

## 2016-06-11 DIAGNOSIS — I2699 Other pulmonary embolism without acute cor pulmonale: Secondary | ICD-10-CM | POA: Diagnosis not present

## 2016-06-11 DIAGNOSIS — E43 Unspecified severe protein-calorie malnutrition: Secondary | ICD-10-CM | POA: Diagnosis not present

## 2016-06-12 DIAGNOSIS — D649 Anemia, unspecified: Secondary | ICD-10-CM | POA: Diagnosis not present

## 2016-06-12 DIAGNOSIS — E43 Unspecified severe protein-calorie malnutrition: Secondary | ICD-10-CM | POA: Diagnosis not present

## 2016-06-12 DIAGNOSIS — K922 Gastrointestinal hemorrhage, unspecified: Secondary | ICD-10-CM | POA: Diagnosis not present

## 2016-06-12 DIAGNOSIS — J9621 Acute and chronic respiratory failure with hypoxia: Secondary | ICD-10-CM | POA: Diagnosis not present

## 2016-06-12 DIAGNOSIS — I2699 Other pulmonary embolism without acute cor pulmonale: Secondary | ICD-10-CM | POA: Diagnosis not present

## 2016-06-12 DIAGNOSIS — Z9981 Dependence on supplemental oxygen: Secondary | ICD-10-CM | POA: Diagnosis not present

## 2016-06-12 DIAGNOSIS — I82401 Acute embolism and thrombosis of unspecified deep veins of right lower extremity: Secondary | ICD-10-CM | POA: Diagnosis not present

## 2016-06-12 DIAGNOSIS — E8809 Other disorders of plasma-protein metabolism, not elsewhere classified: Secondary | ICD-10-CM | POA: Diagnosis not present

## 2016-06-12 DIAGNOSIS — C712 Malignant neoplasm of temporal lobe: Secondary | ICD-10-CM | POA: Diagnosis not present

## 2016-06-13 DIAGNOSIS — D649 Anemia, unspecified: Secondary | ICD-10-CM | POA: Diagnosis not present

## 2016-06-13 DIAGNOSIS — C712 Malignant neoplasm of temporal lobe: Secondary | ICD-10-CM | POA: Diagnosis not present

## 2016-06-13 DIAGNOSIS — K922 Gastrointestinal hemorrhage, unspecified: Secondary | ICD-10-CM | POA: Diagnosis not present

## 2016-06-13 DIAGNOSIS — E43 Unspecified severe protein-calorie malnutrition: Secondary | ICD-10-CM | POA: Diagnosis not present

## 2016-06-13 DIAGNOSIS — I82401 Acute embolism and thrombosis of unspecified deep veins of right lower extremity: Secondary | ICD-10-CM | POA: Diagnosis not present

## 2016-06-13 DIAGNOSIS — E8809 Other disorders of plasma-protein metabolism, not elsewhere classified: Secondary | ICD-10-CM | POA: Diagnosis not present

## 2016-06-13 DIAGNOSIS — J9621 Acute and chronic respiratory failure with hypoxia: Secondary | ICD-10-CM | POA: Diagnosis not present

## 2016-06-13 DIAGNOSIS — Z9981 Dependence on supplemental oxygen: Secondary | ICD-10-CM | POA: Diagnosis not present

## 2016-06-13 DIAGNOSIS — I2699 Other pulmonary embolism without acute cor pulmonale: Secondary | ICD-10-CM | POA: Diagnosis not present

## 2016-06-14 DIAGNOSIS — D649 Anemia, unspecified: Secondary | ICD-10-CM | POA: Diagnosis not present

## 2016-06-14 DIAGNOSIS — C712 Malignant neoplasm of temporal lobe: Secondary | ICD-10-CM | POA: Diagnosis not present

## 2016-06-14 DIAGNOSIS — J9621 Acute and chronic respiratory failure with hypoxia: Secondary | ICD-10-CM | POA: Diagnosis not present

## 2016-06-14 DIAGNOSIS — K922 Gastrointestinal hemorrhage, unspecified: Secondary | ICD-10-CM | POA: Diagnosis not present

## 2016-06-14 DIAGNOSIS — I2699 Other pulmonary embolism without acute cor pulmonale: Secondary | ICD-10-CM | POA: Diagnosis not present

## 2016-06-14 DIAGNOSIS — E8809 Other disorders of plasma-protein metabolism, not elsewhere classified: Secondary | ICD-10-CM | POA: Diagnosis not present

## 2016-06-14 DIAGNOSIS — Z9981 Dependence on supplemental oxygen: Secondary | ICD-10-CM | POA: Diagnosis not present

## 2016-06-14 DIAGNOSIS — E43 Unspecified severe protein-calorie malnutrition: Secondary | ICD-10-CM | POA: Diagnosis not present

## 2016-06-14 DIAGNOSIS — I82401 Acute embolism and thrombosis of unspecified deep veins of right lower extremity: Secondary | ICD-10-CM | POA: Diagnosis not present

## 2016-06-15 DIAGNOSIS — I82401 Acute embolism and thrombosis of unspecified deep veins of right lower extremity: Secondary | ICD-10-CM | POA: Diagnosis not present

## 2016-06-15 DIAGNOSIS — Z9981 Dependence on supplemental oxygen: Secondary | ICD-10-CM | POA: Diagnosis not present

## 2016-06-15 DIAGNOSIS — I2699 Other pulmonary embolism without acute cor pulmonale: Secondary | ICD-10-CM | POA: Diagnosis not present

## 2016-06-15 DIAGNOSIS — C712 Malignant neoplasm of temporal lobe: Secondary | ICD-10-CM | POA: Diagnosis not present

## 2016-06-15 DIAGNOSIS — D649 Anemia, unspecified: Secondary | ICD-10-CM | POA: Diagnosis not present

## 2016-06-15 DIAGNOSIS — E43 Unspecified severe protein-calorie malnutrition: Secondary | ICD-10-CM | POA: Diagnosis not present

## 2016-06-15 DIAGNOSIS — E8809 Other disorders of plasma-protein metabolism, not elsewhere classified: Secondary | ICD-10-CM | POA: Diagnosis not present

## 2016-06-15 DIAGNOSIS — J9621 Acute and chronic respiratory failure with hypoxia: Secondary | ICD-10-CM | POA: Diagnosis not present

## 2016-06-15 DIAGNOSIS — K922 Gastrointestinal hemorrhage, unspecified: Secondary | ICD-10-CM | POA: Diagnosis not present

## 2016-06-16 DIAGNOSIS — C712 Malignant neoplasm of temporal lobe: Secondary | ICD-10-CM | POA: Diagnosis not present

## 2016-06-16 DIAGNOSIS — I82401 Acute embolism and thrombosis of unspecified deep veins of right lower extremity: Secondary | ICD-10-CM | POA: Diagnosis not present

## 2016-06-16 DIAGNOSIS — Z9981 Dependence on supplemental oxygen: Secondary | ICD-10-CM | POA: Diagnosis not present

## 2016-06-16 DIAGNOSIS — K922 Gastrointestinal hemorrhage, unspecified: Secondary | ICD-10-CM | POA: Diagnosis not present

## 2016-06-16 DIAGNOSIS — E43 Unspecified severe protein-calorie malnutrition: Secondary | ICD-10-CM | POA: Diagnosis not present

## 2016-06-16 DIAGNOSIS — J9621 Acute and chronic respiratory failure with hypoxia: Secondary | ICD-10-CM | POA: Diagnosis not present

## 2016-06-16 DIAGNOSIS — E8809 Other disorders of plasma-protein metabolism, not elsewhere classified: Secondary | ICD-10-CM | POA: Diagnosis not present

## 2016-06-16 DIAGNOSIS — I2699 Other pulmonary embolism without acute cor pulmonale: Secondary | ICD-10-CM | POA: Diagnosis not present

## 2016-06-16 DIAGNOSIS — D649 Anemia, unspecified: Secondary | ICD-10-CM | POA: Diagnosis not present

## 2016-06-17 DIAGNOSIS — I82401 Acute embolism and thrombosis of unspecified deep veins of right lower extremity: Secondary | ICD-10-CM | POA: Diagnosis not present

## 2016-06-17 DIAGNOSIS — E43 Unspecified severe protein-calorie malnutrition: Secondary | ICD-10-CM | POA: Diagnosis not present

## 2016-06-17 DIAGNOSIS — C712 Malignant neoplasm of temporal lobe: Secondary | ICD-10-CM | POA: Diagnosis not present

## 2016-06-17 DIAGNOSIS — D649 Anemia, unspecified: Secondary | ICD-10-CM | POA: Diagnosis not present

## 2016-06-17 DIAGNOSIS — I2699 Other pulmonary embolism without acute cor pulmonale: Secondary | ICD-10-CM | POA: Diagnosis not present

## 2016-06-17 DIAGNOSIS — Z9981 Dependence on supplemental oxygen: Secondary | ICD-10-CM | POA: Diagnosis not present

## 2016-06-17 DIAGNOSIS — K922 Gastrointestinal hemorrhage, unspecified: Secondary | ICD-10-CM | POA: Diagnosis not present

## 2016-06-17 DIAGNOSIS — J9621 Acute and chronic respiratory failure with hypoxia: Secondary | ICD-10-CM | POA: Diagnosis not present

## 2016-06-17 DIAGNOSIS — E8809 Other disorders of plasma-protein metabolism, not elsewhere classified: Secondary | ICD-10-CM | POA: Diagnosis not present

## 2016-06-18 DIAGNOSIS — C712 Malignant neoplasm of temporal lobe: Secondary | ICD-10-CM | POA: Diagnosis not present

## 2016-06-18 DIAGNOSIS — E8809 Other disorders of plasma-protein metabolism, not elsewhere classified: Secondary | ICD-10-CM | POA: Diagnosis not present

## 2016-06-18 DIAGNOSIS — Z9981 Dependence on supplemental oxygen: Secondary | ICD-10-CM | POA: Diagnosis not present

## 2016-06-18 DIAGNOSIS — E43 Unspecified severe protein-calorie malnutrition: Secondary | ICD-10-CM | POA: Diagnosis not present

## 2016-06-18 DIAGNOSIS — K922 Gastrointestinal hemorrhage, unspecified: Secondary | ICD-10-CM | POA: Diagnosis not present

## 2016-06-18 DIAGNOSIS — I82401 Acute embolism and thrombosis of unspecified deep veins of right lower extremity: Secondary | ICD-10-CM | POA: Diagnosis not present

## 2016-06-18 DIAGNOSIS — J9621 Acute and chronic respiratory failure with hypoxia: Secondary | ICD-10-CM | POA: Diagnosis not present

## 2016-06-18 DIAGNOSIS — I2699 Other pulmonary embolism without acute cor pulmonale: Secondary | ICD-10-CM | POA: Diagnosis not present

## 2016-06-18 DIAGNOSIS — D649 Anemia, unspecified: Secondary | ICD-10-CM | POA: Diagnosis not present

## 2016-06-19 DIAGNOSIS — J9621 Acute and chronic respiratory failure with hypoxia: Secondary | ICD-10-CM | POA: Diagnosis not present

## 2016-06-19 DIAGNOSIS — K922 Gastrointestinal hemorrhage, unspecified: Secondary | ICD-10-CM | POA: Diagnosis not present

## 2016-06-19 DIAGNOSIS — I2699 Other pulmonary embolism without acute cor pulmonale: Secondary | ICD-10-CM | POA: Diagnosis not present

## 2016-06-19 DIAGNOSIS — C712 Malignant neoplasm of temporal lobe: Secondary | ICD-10-CM | POA: Diagnosis not present

## 2016-06-19 DIAGNOSIS — I82401 Acute embolism and thrombosis of unspecified deep veins of right lower extremity: Secondary | ICD-10-CM | POA: Diagnosis not present

## 2016-06-19 DIAGNOSIS — Z9981 Dependence on supplemental oxygen: Secondary | ICD-10-CM | POA: Diagnosis not present

## 2016-06-19 DIAGNOSIS — E43 Unspecified severe protein-calorie malnutrition: Secondary | ICD-10-CM | POA: Diagnosis not present

## 2016-06-19 DIAGNOSIS — E8809 Other disorders of plasma-protein metabolism, not elsewhere classified: Secondary | ICD-10-CM | POA: Diagnosis not present

## 2016-06-19 DIAGNOSIS — D649 Anemia, unspecified: Secondary | ICD-10-CM | POA: Diagnosis not present

## 2016-06-20 DIAGNOSIS — J9621 Acute and chronic respiratory failure with hypoxia: Secondary | ICD-10-CM | POA: Diagnosis not present

## 2016-06-20 DIAGNOSIS — C712 Malignant neoplasm of temporal lobe: Secondary | ICD-10-CM | POA: Diagnosis not present

## 2016-06-20 DIAGNOSIS — K922 Gastrointestinal hemorrhage, unspecified: Secondary | ICD-10-CM | POA: Diagnosis not present

## 2016-06-20 DIAGNOSIS — Z9981 Dependence on supplemental oxygen: Secondary | ICD-10-CM | POA: Diagnosis not present

## 2016-06-20 DIAGNOSIS — D649 Anemia, unspecified: Secondary | ICD-10-CM | POA: Diagnosis not present

## 2016-06-20 DIAGNOSIS — I2699 Other pulmonary embolism without acute cor pulmonale: Secondary | ICD-10-CM | POA: Diagnosis not present

## 2016-06-20 DIAGNOSIS — E43 Unspecified severe protein-calorie malnutrition: Secondary | ICD-10-CM | POA: Diagnosis not present

## 2016-06-20 DIAGNOSIS — I82401 Acute embolism and thrombosis of unspecified deep veins of right lower extremity: Secondary | ICD-10-CM | POA: Diagnosis not present

## 2016-06-20 DIAGNOSIS — E8809 Other disorders of plasma-protein metabolism, not elsewhere classified: Secondary | ICD-10-CM | POA: Diagnosis not present

## 2016-06-21 DIAGNOSIS — E43 Unspecified severe protein-calorie malnutrition: Secondary | ICD-10-CM | POA: Diagnosis not present

## 2016-06-21 DIAGNOSIS — I82401 Acute embolism and thrombosis of unspecified deep veins of right lower extremity: Secondary | ICD-10-CM | POA: Diagnosis not present

## 2016-06-21 DIAGNOSIS — J9621 Acute and chronic respiratory failure with hypoxia: Secondary | ICD-10-CM | POA: Diagnosis not present

## 2016-06-21 DIAGNOSIS — Z9981 Dependence on supplemental oxygen: Secondary | ICD-10-CM | POA: Diagnosis not present

## 2016-06-21 DIAGNOSIS — C712 Malignant neoplasm of temporal lobe: Secondary | ICD-10-CM | POA: Diagnosis not present

## 2016-06-21 DIAGNOSIS — K922 Gastrointestinal hemorrhage, unspecified: Secondary | ICD-10-CM | POA: Diagnosis not present

## 2016-06-21 DIAGNOSIS — D649 Anemia, unspecified: Secondary | ICD-10-CM | POA: Diagnosis not present

## 2016-06-21 DIAGNOSIS — E8809 Other disorders of plasma-protein metabolism, not elsewhere classified: Secondary | ICD-10-CM | POA: Diagnosis not present

## 2016-06-21 DIAGNOSIS — I2699 Other pulmonary embolism without acute cor pulmonale: Secondary | ICD-10-CM | POA: Diagnosis not present

## 2016-06-22 DIAGNOSIS — E8809 Other disorders of plasma-protein metabolism, not elsewhere classified: Secondary | ICD-10-CM | POA: Diagnosis not present

## 2016-06-22 DIAGNOSIS — C712 Malignant neoplasm of temporal lobe: Secondary | ICD-10-CM | POA: Diagnosis not present

## 2016-06-22 DIAGNOSIS — J9621 Acute and chronic respiratory failure with hypoxia: Secondary | ICD-10-CM | POA: Diagnosis not present

## 2016-06-22 DIAGNOSIS — K922 Gastrointestinal hemorrhage, unspecified: Secondary | ICD-10-CM | POA: Diagnosis not present

## 2016-06-22 DIAGNOSIS — I2699 Other pulmonary embolism without acute cor pulmonale: Secondary | ICD-10-CM | POA: Diagnosis not present

## 2016-06-22 DIAGNOSIS — E43 Unspecified severe protein-calorie malnutrition: Secondary | ICD-10-CM | POA: Diagnosis not present

## 2016-06-22 DIAGNOSIS — D649 Anemia, unspecified: Secondary | ICD-10-CM | POA: Diagnosis not present

## 2016-06-22 DIAGNOSIS — Z9981 Dependence on supplemental oxygen: Secondary | ICD-10-CM | POA: Diagnosis not present

## 2016-06-22 DIAGNOSIS — I82401 Acute embolism and thrombosis of unspecified deep veins of right lower extremity: Secondary | ICD-10-CM | POA: Diagnosis not present

## 2016-06-23 DIAGNOSIS — I2699 Other pulmonary embolism without acute cor pulmonale: Secondary | ICD-10-CM | POA: Diagnosis not present

## 2016-06-23 DIAGNOSIS — Z9981 Dependence on supplemental oxygen: Secondary | ICD-10-CM | POA: Diagnosis not present

## 2016-06-23 DIAGNOSIS — D649 Anemia, unspecified: Secondary | ICD-10-CM | POA: Diagnosis not present

## 2016-06-23 DIAGNOSIS — C712 Malignant neoplasm of temporal lobe: Secondary | ICD-10-CM | POA: Diagnosis not present

## 2016-06-23 DIAGNOSIS — E8809 Other disorders of plasma-protein metabolism, not elsewhere classified: Secondary | ICD-10-CM | POA: Diagnosis not present

## 2016-06-23 DIAGNOSIS — K922 Gastrointestinal hemorrhage, unspecified: Secondary | ICD-10-CM | POA: Diagnosis not present

## 2016-06-23 DIAGNOSIS — E43 Unspecified severe protein-calorie malnutrition: Secondary | ICD-10-CM | POA: Diagnosis not present

## 2016-06-23 DIAGNOSIS — J9621 Acute and chronic respiratory failure with hypoxia: Secondary | ICD-10-CM | POA: Diagnosis not present

## 2016-06-23 DIAGNOSIS — I82401 Acute embolism and thrombosis of unspecified deep veins of right lower extremity: Secondary | ICD-10-CM | POA: Diagnosis not present

## 2016-06-24 DIAGNOSIS — E43 Unspecified severe protein-calorie malnutrition: Secondary | ICD-10-CM | POA: Diagnosis not present

## 2016-06-24 DIAGNOSIS — Z9981 Dependence on supplemental oxygen: Secondary | ICD-10-CM | POA: Diagnosis not present

## 2016-06-24 DIAGNOSIS — E8809 Other disorders of plasma-protein metabolism, not elsewhere classified: Secondary | ICD-10-CM | POA: Diagnosis not present

## 2016-06-24 DIAGNOSIS — I82401 Acute embolism and thrombosis of unspecified deep veins of right lower extremity: Secondary | ICD-10-CM | POA: Diagnosis not present

## 2016-06-24 DIAGNOSIS — J9621 Acute and chronic respiratory failure with hypoxia: Secondary | ICD-10-CM | POA: Diagnosis not present

## 2016-06-24 DIAGNOSIS — D649 Anemia, unspecified: Secondary | ICD-10-CM | POA: Diagnosis not present

## 2016-06-24 DIAGNOSIS — I2699 Other pulmonary embolism without acute cor pulmonale: Secondary | ICD-10-CM | POA: Diagnosis not present

## 2016-06-24 DIAGNOSIS — C712 Malignant neoplasm of temporal lobe: Secondary | ICD-10-CM | POA: Diagnosis not present

## 2016-06-24 DIAGNOSIS — K922 Gastrointestinal hemorrhage, unspecified: Secondary | ICD-10-CM | POA: Diagnosis not present

## 2016-06-25 DIAGNOSIS — K922 Gastrointestinal hemorrhage, unspecified: Secondary | ICD-10-CM | POA: Diagnosis not present

## 2016-06-25 DIAGNOSIS — C712 Malignant neoplasm of temporal lobe: Secondary | ICD-10-CM | POA: Diagnosis not present

## 2016-06-25 DIAGNOSIS — D649 Anemia, unspecified: Secondary | ICD-10-CM | POA: Diagnosis not present

## 2016-06-25 DIAGNOSIS — I82401 Acute embolism and thrombosis of unspecified deep veins of right lower extremity: Secondary | ICD-10-CM | POA: Diagnosis not present

## 2016-06-25 DIAGNOSIS — I2699 Other pulmonary embolism without acute cor pulmonale: Secondary | ICD-10-CM | POA: Diagnosis not present

## 2016-06-25 DIAGNOSIS — E43 Unspecified severe protein-calorie malnutrition: Secondary | ICD-10-CM | POA: Diagnosis not present

## 2016-06-25 DIAGNOSIS — E8809 Other disorders of plasma-protein metabolism, not elsewhere classified: Secondary | ICD-10-CM | POA: Diagnosis not present

## 2016-06-25 DIAGNOSIS — J9621 Acute and chronic respiratory failure with hypoxia: Secondary | ICD-10-CM | POA: Diagnosis not present

## 2016-06-25 DIAGNOSIS — Z9981 Dependence on supplemental oxygen: Secondary | ICD-10-CM | POA: Diagnosis not present

## 2016-06-26 DIAGNOSIS — C712 Malignant neoplasm of temporal lobe: Secondary | ICD-10-CM | POA: Diagnosis not present

## 2016-06-26 DIAGNOSIS — E43 Unspecified severe protein-calorie malnutrition: Secondary | ICD-10-CM | POA: Diagnosis not present

## 2016-06-26 DIAGNOSIS — Z9981 Dependence on supplemental oxygen: Secondary | ICD-10-CM | POA: Diagnosis not present

## 2016-06-26 DIAGNOSIS — I82401 Acute embolism and thrombosis of unspecified deep veins of right lower extremity: Secondary | ICD-10-CM | POA: Diagnosis not present

## 2016-06-26 DIAGNOSIS — J9621 Acute and chronic respiratory failure with hypoxia: Secondary | ICD-10-CM | POA: Diagnosis not present

## 2016-06-26 DIAGNOSIS — I2699 Other pulmonary embolism without acute cor pulmonale: Secondary | ICD-10-CM | POA: Diagnosis not present

## 2016-06-26 DIAGNOSIS — K922 Gastrointestinal hemorrhage, unspecified: Secondary | ICD-10-CM | POA: Diagnosis not present

## 2016-06-26 DIAGNOSIS — E8809 Other disorders of plasma-protein metabolism, not elsewhere classified: Secondary | ICD-10-CM | POA: Diagnosis not present

## 2016-06-26 DIAGNOSIS — D649 Anemia, unspecified: Secondary | ICD-10-CM | POA: Diagnosis not present

## 2016-06-27 DIAGNOSIS — E43 Unspecified severe protein-calorie malnutrition: Secondary | ICD-10-CM | POA: Diagnosis not present

## 2016-06-27 DIAGNOSIS — E8809 Other disorders of plasma-protein metabolism, not elsewhere classified: Secondary | ICD-10-CM | POA: Diagnosis not present

## 2016-06-27 DIAGNOSIS — I2699 Other pulmonary embolism without acute cor pulmonale: Secondary | ICD-10-CM | POA: Diagnosis not present

## 2016-06-27 DIAGNOSIS — K922 Gastrointestinal hemorrhage, unspecified: Secondary | ICD-10-CM | POA: Diagnosis not present

## 2016-06-27 DIAGNOSIS — J9621 Acute and chronic respiratory failure with hypoxia: Secondary | ICD-10-CM | POA: Diagnosis not present

## 2016-06-27 DIAGNOSIS — D649 Anemia, unspecified: Secondary | ICD-10-CM | POA: Diagnosis not present

## 2016-06-27 DIAGNOSIS — C712 Malignant neoplasm of temporal lobe: Secondary | ICD-10-CM | POA: Diagnosis not present

## 2016-06-27 DIAGNOSIS — Z9981 Dependence on supplemental oxygen: Secondary | ICD-10-CM | POA: Diagnosis not present

## 2016-06-27 DIAGNOSIS — I82401 Acute embolism and thrombosis of unspecified deep veins of right lower extremity: Secondary | ICD-10-CM | POA: Diagnosis not present

## 2016-06-28 DIAGNOSIS — Z9981 Dependence on supplemental oxygen: Secondary | ICD-10-CM | POA: Diagnosis not present

## 2016-06-28 DIAGNOSIS — C712 Malignant neoplasm of temporal lobe: Secondary | ICD-10-CM | POA: Diagnosis not present

## 2016-06-28 DIAGNOSIS — K922 Gastrointestinal hemorrhage, unspecified: Secondary | ICD-10-CM | POA: Diagnosis not present

## 2016-06-28 DIAGNOSIS — E43 Unspecified severe protein-calorie malnutrition: Secondary | ICD-10-CM | POA: Diagnosis not present

## 2016-06-28 DIAGNOSIS — I82401 Acute embolism and thrombosis of unspecified deep veins of right lower extremity: Secondary | ICD-10-CM | POA: Diagnosis not present

## 2016-06-28 DIAGNOSIS — I2699 Other pulmonary embolism without acute cor pulmonale: Secondary | ICD-10-CM | POA: Diagnosis not present

## 2016-06-28 DIAGNOSIS — E8809 Other disorders of plasma-protein metabolism, not elsewhere classified: Secondary | ICD-10-CM | POA: Diagnosis not present

## 2016-06-28 DIAGNOSIS — J9621 Acute and chronic respiratory failure with hypoxia: Secondary | ICD-10-CM | POA: Diagnosis not present

## 2016-06-28 DIAGNOSIS — D649 Anemia, unspecified: Secondary | ICD-10-CM | POA: Diagnosis not present

## 2016-06-29 DIAGNOSIS — J9621 Acute and chronic respiratory failure with hypoxia: Secondary | ICD-10-CM | POA: Diagnosis not present

## 2016-06-29 DIAGNOSIS — C712 Malignant neoplasm of temporal lobe: Secondary | ICD-10-CM | POA: Diagnosis not present

## 2016-06-29 DIAGNOSIS — Z9981 Dependence on supplemental oxygen: Secondary | ICD-10-CM | POA: Diagnosis not present

## 2016-06-29 DIAGNOSIS — I2699 Other pulmonary embolism without acute cor pulmonale: Secondary | ICD-10-CM | POA: Diagnosis not present

## 2016-06-29 DIAGNOSIS — I82401 Acute embolism and thrombosis of unspecified deep veins of right lower extremity: Secondary | ICD-10-CM | POA: Diagnosis not present

## 2016-06-29 DIAGNOSIS — E8809 Other disorders of plasma-protein metabolism, not elsewhere classified: Secondary | ICD-10-CM | POA: Diagnosis not present

## 2016-06-29 DIAGNOSIS — E43 Unspecified severe protein-calorie malnutrition: Secondary | ICD-10-CM | POA: Diagnosis not present

## 2016-06-29 DIAGNOSIS — K922 Gastrointestinal hemorrhage, unspecified: Secondary | ICD-10-CM | POA: Diagnosis not present

## 2016-06-29 DIAGNOSIS — D649 Anemia, unspecified: Secondary | ICD-10-CM | POA: Diagnosis not present

## 2016-06-30 DIAGNOSIS — I2699 Other pulmonary embolism without acute cor pulmonale: Secondary | ICD-10-CM | POA: Diagnosis not present

## 2016-06-30 DIAGNOSIS — I82401 Acute embolism and thrombosis of unspecified deep veins of right lower extremity: Secondary | ICD-10-CM | POA: Diagnosis not present

## 2016-06-30 DIAGNOSIS — E8809 Other disorders of plasma-protein metabolism, not elsewhere classified: Secondary | ICD-10-CM | POA: Diagnosis not present

## 2016-06-30 DIAGNOSIS — E43 Unspecified severe protein-calorie malnutrition: Secondary | ICD-10-CM | POA: Diagnosis not present

## 2016-06-30 DIAGNOSIS — D649 Anemia, unspecified: Secondary | ICD-10-CM | POA: Diagnosis not present

## 2016-06-30 DIAGNOSIS — Z9981 Dependence on supplemental oxygen: Secondary | ICD-10-CM | POA: Diagnosis not present

## 2016-06-30 DIAGNOSIS — K922 Gastrointestinal hemorrhage, unspecified: Secondary | ICD-10-CM | POA: Diagnosis not present

## 2016-06-30 DIAGNOSIS — J9621 Acute and chronic respiratory failure with hypoxia: Secondary | ICD-10-CM | POA: Diagnosis not present

## 2016-06-30 DIAGNOSIS — C712 Malignant neoplasm of temporal lobe: Secondary | ICD-10-CM | POA: Diagnosis not present

## 2016-07-01 DIAGNOSIS — E8809 Other disorders of plasma-protein metabolism, not elsewhere classified: Secondary | ICD-10-CM | POA: Diagnosis not present

## 2016-07-01 DIAGNOSIS — K922 Gastrointestinal hemorrhage, unspecified: Secondary | ICD-10-CM | POA: Diagnosis not present

## 2016-07-01 DIAGNOSIS — E43 Unspecified severe protein-calorie malnutrition: Secondary | ICD-10-CM | POA: Diagnosis not present

## 2016-07-01 DIAGNOSIS — I82401 Acute embolism and thrombosis of unspecified deep veins of right lower extremity: Secondary | ICD-10-CM | POA: Diagnosis not present

## 2016-07-01 DIAGNOSIS — J9621 Acute and chronic respiratory failure with hypoxia: Secondary | ICD-10-CM | POA: Diagnosis not present

## 2016-07-01 DIAGNOSIS — Z9981 Dependence on supplemental oxygen: Secondary | ICD-10-CM | POA: Diagnosis not present

## 2016-07-01 DIAGNOSIS — D649 Anemia, unspecified: Secondary | ICD-10-CM | POA: Diagnosis not present

## 2016-07-01 DIAGNOSIS — C712 Malignant neoplasm of temporal lobe: Secondary | ICD-10-CM | POA: Diagnosis not present

## 2016-07-01 DIAGNOSIS — I2699 Other pulmonary embolism without acute cor pulmonale: Secondary | ICD-10-CM | POA: Diagnosis not present

## 2016-07-02 DIAGNOSIS — I82401 Acute embolism and thrombosis of unspecified deep veins of right lower extremity: Secondary | ICD-10-CM | POA: Diagnosis not present

## 2016-07-02 DIAGNOSIS — Z9981 Dependence on supplemental oxygen: Secondary | ICD-10-CM | POA: Diagnosis not present

## 2016-07-02 DIAGNOSIS — J9621 Acute and chronic respiratory failure with hypoxia: Secondary | ICD-10-CM | POA: Diagnosis not present

## 2016-07-02 DIAGNOSIS — E43 Unspecified severe protein-calorie malnutrition: Secondary | ICD-10-CM | POA: Diagnosis not present

## 2016-07-02 DIAGNOSIS — C712 Malignant neoplasm of temporal lobe: Secondary | ICD-10-CM | POA: Diagnosis not present

## 2016-07-02 DIAGNOSIS — E8809 Other disorders of plasma-protein metabolism, not elsewhere classified: Secondary | ICD-10-CM | POA: Diagnosis not present

## 2016-07-02 DIAGNOSIS — K922 Gastrointestinal hemorrhage, unspecified: Secondary | ICD-10-CM | POA: Diagnosis not present

## 2016-07-02 DIAGNOSIS — D649 Anemia, unspecified: Secondary | ICD-10-CM | POA: Diagnosis not present

## 2016-07-02 DIAGNOSIS — I2699 Other pulmonary embolism without acute cor pulmonale: Secondary | ICD-10-CM | POA: Diagnosis not present

## 2016-07-03 DIAGNOSIS — E8809 Other disorders of plasma-protein metabolism, not elsewhere classified: Secondary | ICD-10-CM | POA: Diagnosis not present

## 2016-07-03 DIAGNOSIS — I2699 Other pulmonary embolism without acute cor pulmonale: Secondary | ICD-10-CM | POA: Diagnosis not present

## 2016-07-03 DIAGNOSIS — J9621 Acute and chronic respiratory failure with hypoxia: Secondary | ICD-10-CM | POA: Diagnosis not present

## 2016-07-03 DIAGNOSIS — Z9981 Dependence on supplemental oxygen: Secondary | ICD-10-CM | POA: Diagnosis not present

## 2016-07-03 DIAGNOSIS — K922 Gastrointestinal hemorrhage, unspecified: Secondary | ICD-10-CM | POA: Diagnosis not present

## 2016-07-03 DIAGNOSIS — E43 Unspecified severe protein-calorie malnutrition: Secondary | ICD-10-CM | POA: Diagnosis not present

## 2016-07-03 DIAGNOSIS — D649 Anemia, unspecified: Secondary | ICD-10-CM | POA: Diagnosis not present

## 2016-07-03 DIAGNOSIS — C712 Malignant neoplasm of temporal lobe: Secondary | ICD-10-CM | POA: Diagnosis not present

## 2016-07-03 DIAGNOSIS — I82401 Acute embolism and thrombosis of unspecified deep veins of right lower extremity: Secondary | ICD-10-CM | POA: Diagnosis not present

## 2016-07-04 DIAGNOSIS — J9621 Acute and chronic respiratory failure with hypoxia: Secondary | ICD-10-CM | POA: Diagnosis not present

## 2016-07-04 DIAGNOSIS — I2699 Other pulmonary embolism without acute cor pulmonale: Secondary | ICD-10-CM | POA: Diagnosis not present

## 2016-07-04 DIAGNOSIS — E8809 Other disorders of plasma-protein metabolism, not elsewhere classified: Secondary | ICD-10-CM | POA: Diagnosis not present

## 2016-07-04 DIAGNOSIS — Z9981 Dependence on supplemental oxygen: Secondary | ICD-10-CM | POA: Diagnosis not present

## 2016-07-04 DIAGNOSIS — I82401 Acute embolism and thrombosis of unspecified deep veins of right lower extremity: Secondary | ICD-10-CM | POA: Diagnosis not present

## 2016-07-04 DIAGNOSIS — D649 Anemia, unspecified: Secondary | ICD-10-CM | POA: Diagnosis not present

## 2016-07-04 DIAGNOSIS — C712 Malignant neoplasm of temporal lobe: Secondary | ICD-10-CM | POA: Diagnosis not present

## 2016-07-04 DIAGNOSIS — K922 Gastrointestinal hemorrhage, unspecified: Secondary | ICD-10-CM | POA: Diagnosis not present

## 2016-07-04 DIAGNOSIS — E43 Unspecified severe protein-calorie malnutrition: Secondary | ICD-10-CM | POA: Diagnosis not present

## 2016-07-05 DIAGNOSIS — E8809 Other disorders of plasma-protein metabolism, not elsewhere classified: Secondary | ICD-10-CM | POA: Diagnosis not present

## 2016-07-05 DIAGNOSIS — E43 Unspecified severe protein-calorie malnutrition: Secondary | ICD-10-CM | POA: Diagnosis not present

## 2016-07-05 DIAGNOSIS — I2699 Other pulmonary embolism without acute cor pulmonale: Secondary | ICD-10-CM | POA: Diagnosis not present

## 2016-07-05 DIAGNOSIS — I82401 Acute embolism and thrombosis of unspecified deep veins of right lower extremity: Secondary | ICD-10-CM | POA: Diagnosis not present

## 2016-07-05 DIAGNOSIS — J9621 Acute and chronic respiratory failure with hypoxia: Secondary | ICD-10-CM | POA: Diagnosis not present

## 2016-07-05 DIAGNOSIS — Z9981 Dependence on supplemental oxygen: Secondary | ICD-10-CM | POA: Diagnosis not present

## 2016-07-05 DIAGNOSIS — K922 Gastrointestinal hemorrhage, unspecified: Secondary | ICD-10-CM | POA: Diagnosis not present

## 2016-07-05 DIAGNOSIS — D649 Anemia, unspecified: Secondary | ICD-10-CM | POA: Diagnosis not present

## 2016-07-05 DIAGNOSIS — C712 Malignant neoplasm of temporal lobe: Secondary | ICD-10-CM | POA: Diagnosis not present

## 2016-07-06 DIAGNOSIS — J9621 Acute and chronic respiratory failure with hypoxia: Secondary | ICD-10-CM | POA: Diagnosis not present

## 2016-07-06 DIAGNOSIS — Z9981 Dependence on supplemental oxygen: Secondary | ICD-10-CM | POA: Diagnosis not present

## 2016-07-06 DIAGNOSIS — I82401 Acute embolism and thrombosis of unspecified deep veins of right lower extremity: Secondary | ICD-10-CM | POA: Diagnosis not present

## 2016-07-06 DIAGNOSIS — I2699 Other pulmonary embolism without acute cor pulmonale: Secondary | ICD-10-CM | POA: Diagnosis not present

## 2016-07-06 DIAGNOSIS — C712 Malignant neoplasm of temporal lobe: Secondary | ICD-10-CM | POA: Diagnosis not present

## 2016-07-06 DIAGNOSIS — K922 Gastrointestinal hemorrhage, unspecified: Secondary | ICD-10-CM | POA: Diagnosis not present

## 2016-07-06 DIAGNOSIS — E8809 Other disorders of plasma-protein metabolism, not elsewhere classified: Secondary | ICD-10-CM | POA: Diagnosis not present

## 2016-07-06 DIAGNOSIS — D649 Anemia, unspecified: Secondary | ICD-10-CM | POA: Diagnosis not present

## 2016-07-06 DIAGNOSIS — E43 Unspecified severe protein-calorie malnutrition: Secondary | ICD-10-CM | POA: Diagnosis not present

## 2016-07-07 DIAGNOSIS — D649 Anemia, unspecified: Secondary | ICD-10-CM | POA: Diagnosis not present

## 2016-07-07 DIAGNOSIS — E43 Unspecified severe protein-calorie malnutrition: Secondary | ICD-10-CM | POA: Diagnosis not present

## 2016-07-07 DIAGNOSIS — Z9981 Dependence on supplemental oxygen: Secondary | ICD-10-CM | POA: Diagnosis not present

## 2016-07-07 DIAGNOSIS — K922 Gastrointestinal hemorrhage, unspecified: Secondary | ICD-10-CM | POA: Diagnosis not present

## 2016-07-07 DIAGNOSIS — C712 Malignant neoplasm of temporal lobe: Secondary | ICD-10-CM | POA: Diagnosis not present

## 2016-07-07 DIAGNOSIS — E8809 Other disorders of plasma-protein metabolism, not elsewhere classified: Secondary | ICD-10-CM | POA: Diagnosis not present

## 2016-07-07 DIAGNOSIS — I2699 Other pulmonary embolism without acute cor pulmonale: Secondary | ICD-10-CM | POA: Diagnosis not present

## 2016-07-07 DIAGNOSIS — I82401 Acute embolism and thrombosis of unspecified deep veins of right lower extremity: Secondary | ICD-10-CM | POA: Diagnosis not present

## 2016-07-07 DIAGNOSIS — J9621 Acute and chronic respiratory failure with hypoxia: Secondary | ICD-10-CM | POA: Diagnosis not present

## 2016-07-08 DIAGNOSIS — I82401 Acute embolism and thrombosis of unspecified deep veins of right lower extremity: Secondary | ICD-10-CM | POA: Diagnosis not present

## 2016-07-08 DIAGNOSIS — I2699 Other pulmonary embolism without acute cor pulmonale: Secondary | ICD-10-CM | POA: Diagnosis not present

## 2016-07-08 DIAGNOSIS — C712 Malignant neoplasm of temporal lobe: Secondary | ICD-10-CM | POA: Diagnosis not present

## 2016-07-08 DIAGNOSIS — J9621 Acute and chronic respiratory failure with hypoxia: Secondary | ICD-10-CM | POA: Diagnosis not present

## 2016-07-08 DIAGNOSIS — D649 Anemia, unspecified: Secondary | ICD-10-CM | POA: Diagnosis not present

## 2016-07-08 DIAGNOSIS — K922 Gastrointestinal hemorrhage, unspecified: Secondary | ICD-10-CM | POA: Diagnosis not present

## 2016-07-08 DIAGNOSIS — E43 Unspecified severe protein-calorie malnutrition: Secondary | ICD-10-CM | POA: Diagnosis not present

## 2016-07-08 DIAGNOSIS — Z9981 Dependence on supplemental oxygen: Secondary | ICD-10-CM | POA: Diagnosis not present

## 2016-07-08 DIAGNOSIS — E8809 Other disorders of plasma-protein metabolism, not elsewhere classified: Secondary | ICD-10-CM | POA: Diagnosis not present

## 2016-07-09 DIAGNOSIS — E43 Unspecified severe protein-calorie malnutrition: Secondary | ICD-10-CM | POA: Diagnosis not present

## 2016-07-09 DIAGNOSIS — C712 Malignant neoplasm of temporal lobe: Secondary | ICD-10-CM | POA: Diagnosis not present

## 2016-07-09 DIAGNOSIS — K922 Gastrointestinal hemorrhage, unspecified: Secondary | ICD-10-CM | POA: Diagnosis not present

## 2016-07-09 DIAGNOSIS — I82401 Acute embolism and thrombosis of unspecified deep veins of right lower extremity: Secondary | ICD-10-CM | POA: Diagnosis not present

## 2016-07-09 DIAGNOSIS — E8809 Other disorders of plasma-protein metabolism, not elsewhere classified: Secondary | ICD-10-CM | POA: Diagnosis not present

## 2016-07-09 DIAGNOSIS — J9621 Acute and chronic respiratory failure with hypoxia: Secondary | ICD-10-CM | POA: Diagnosis not present

## 2016-07-09 DIAGNOSIS — I2699 Other pulmonary embolism without acute cor pulmonale: Secondary | ICD-10-CM | POA: Diagnosis not present

## 2016-07-09 DIAGNOSIS — Z9981 Dependence on supplemental oxygen: Secondary | ICD-10-CM | POA: Diagnosis not present

## 2016-07-09 DIAGNOSIS — D649 Anemia, unspecified: Secondary | ICD-10-CM | POA: Diagnosis not present

## 2016-07-10 DIAGNOSIS — I82401 Acute embolism and thrombosis of unspecified deep veins of right lower extremity: Secondary | ICD-10-CM | POA: Diagnosis not present

## 2016-07-10 DIAGNOSIS — D649 Anemia, unspecified: Secondary | ICD-10-CM | POA: Diagnosis not present

## 2016-07-10 DIAGNOSIS — E43 Unspecified severe protein-calorie malnutrition: Secondary | ICD-10-CM | POA: Diagnosis not present

## 2016-07-10 DIAGNOSIS — E8809 Other disorders of plasma-protein metabolism, not elsewhere classified: Secondary | ICD-10-CM | POA: Diagnosis not present

## 2016-07-10 DIAGNOSIS — Z9981 Dependence on supplemental oxygen: Secondary | ICD-10-CM | POA: Diagnosis not present

## 2016-07-10 DIAGNOSIS — I2699 Other pulmonary embolism without acute cor pulmonale: Secondary | ICD-10-CM | POA: Diagnosis not present

## 2016-07-10 DIAGNOSIS — J9621 Acute and chronic respiratory failure with hypoxia: Secondary | ICD-10-CM | POA: Diagnosis not present

## 2016-07-10 DIAGNOSIS — C712 Malignant neoplasm of temporal lobe: Secondary | ICD-10-CM | POA: Diagnosis not present

## 2016-07-10 DIAGNOSIS — K922 Gastrointestinal hemorrhage, unspecified: Secondary | ICD-10-CM | POA: Diagnosis not present

## 2016-07-11 DIAGNOSIS — C712 Malignant neoplasm of temporal lobe: Secondary | ICD-10-CM | POA: Diagnosis not present

## 2016-07-11 DIAGNOSIS — E8809 Other disorders of plasma-protein metabolism, not elsewhere classified: Secondary | ICD-10-CM | POA: Diagnosis not present

## 2016-07-11 DIAGNOSIS — I82401 Acute embolism and thrombosis of unspecified deep veins of right lower extremity: Secondary | ICD-10-CM | POA: Diagnosis not present

## 2016-07-11 DIAGNOSIS — D649 Anemia, unspecified: Secondary | ICD-10-CM | POA: Diagnosis not present

## 2016-07-11 DIAGNOSIS — I2699 Other pulmonary embolism without acute cor pulmonale: Secondary | ICD-10-CM | POA: Diagnosis not present

## 2016-07-11 DIAGNOSIS — E43 Unspecified severe protein-calorie malnutrition: Secondary | ICD-10-CM | POA: Diagnosis not present

## 2016-07-11 DIAGNOSIS — J9621 Acute and chronic respiratory failure with hypoxia: Secondary | ICD-10-CM | POA: Diagnosis not present

## 2016-07-11 DIAGNOSIS — K922 Gastrointestinal hemorrhage, unspecified: Secondary | ICD-10-CM | POA: Diagnosis not present

## 2016-07-11 DIAGNOSIS — Z9981 Dependence on supplemental oxygen: Secondary | ICD-10-CM | POA: Diagnosis not present

## 2016-07-12 DIAGNOSIS — D649 Anemia, unspecified: Secondary | ICD-10-CM | POA: Diagnosis not present

## 2016-07-12 DIAGNOSIS — E8809 Other disorders of plasma-protein metabolism, not elsewhere classified: Secondary | ICD-10-CM | POA: Diagnosis not present

## 2016-07-12 DIAGNOSIS — K922 Gastrointestinal hemorrhage, unspecified: Secondary | ICD-10-CM | POA: Diagnosis not present

## 2016-07-12 DIAGNOSIS — I2699 Other pulmonary embolism without acute cor pulmonale: Secondary | ICD-10-CM | POA: Diagnosis not present

## 2016-07-12 DIAGNOSIS — Z9981 Dependence on supplemental oxygen: Secondary | ICD-10-CM | POA: Diagnosis not present

## 2016-07-12 DIAGNOSIS — E43 Unspecified severe protein-calorie malnutrition: Secondary | ICD-10-CM | POA: Diagnosis not present

## 2016-07-12 DIAGNOSIS — C712 Malignant neoplasm of temporal lobe: Secondary | ICD-10-CM | POA: Diagnosis not present

## 2016-07-12 DIAGNOSIS — J9621 Acute and chronic respiratory failure with hypoxia: Secondary | ICD-10-CM | POA: Diagnosis not present

## 2016-07-12 DIAGNOSIS — I82401 Acute embolism and thrombosis of unspecified deep veins of right lower extremity: Secondary | ICD-10-CM | POA: Diagnosis not present

## 2016-07-13 DIAGNOSIS — I82401 Acute embolism and thrombosis of unspecified deep veins of right lower extremity: Secondary | ICD-10-CM | POA: Diagnosis not present

## 2016-07-13 DIAGNOSIS — J9621 Acute and chronic respiratory failure with hypoxia: Secondary | ICD-10-CM | POA: Diagnosis not present

## 2016-07-13 DIAGNOSIS — D649 Anemia, unspecified: Secondary | ICD-10-CM | POA: Diagnosis not present

## 2016-07-13 DIAGNOSIS — K922 Gastrointestinal hemorrhage, unspecified: Secondary | ICD-10-CM | POA: Diagnosis not present

## 2016-07-13 DIAGNOSIS — I2699 Other pulmonary embolism without acute cor pulmonale: Secondary | ICD-10-CM | POA: Diagnosis not present

## 2016-07-13 DIAGNOSIS — E8809 Other disorders of plasma-protein metabolism, not elsewhere classified: Secondary | ICD-10-CM | POA: Diagnosis not present

## 2016-07-13 DIAGNOSIS — E43 Unspecified severe protein-calorie malnutrition: Secondary | ICD-10-CM | POA: Diagnosis not present

## 2016-07-13 DIAGNOSIS — Z9981 Dependence on supplemental oxygen: Secondary | ICD-10-CM | POA: Diagnosis not present

## 2016-07-13 DIAGNOSIS — C712 Malignant neoplasm of temporal lobe: Secondary | ICD-10-CM | POA: Diagnosis not present

## 2016-07-14 DIAGNOSIS — J9621 Acute and chronic respiratory failure with hypoxia: Secondary | ICD-10-CM | POA: Diagnosis not present

## 2016-07-14 DIAGNOSIS — K922 Gastrointestinal hemorrhage, unspecified: Secondary | ICD-10-CM | POA: Diagnosis not present

## 2016-07-14 DIAGNOSIS — D649 Anemia, unspecified: Secondary | ICD-10-CM | POA: Diagnosis not present

## 2016-07-14 DIAGNOSIS — Z9981 Dependence on supplemental oxygen: Secondary | ICD-10-CM | POA: Diagnosis not present

## 2016-07-14 DIAGNOSIS — I2699 Other pulmonary embolism without acute cor pulmonale: Secondary | ICD-10-CM | POA: Diagnosis not present

## 2016-07-14 DIAGNOSIS — C712 Malignant neoplasm of temporal lobe: Secondary | ICD-10-CM | POA: Diagnosis not present

## 2016-07-14 DIAGNOSIS — I82401 Acute embolism and thrombosis of unspecified deep veins of right lower extremity: Secondary | ICD-10-CM | POA: Diagnosis not present

## 2016-07-14 DIAGNOSIS — E43 Unspecified severe protein-calorie malnutrition: Secondary | ICD-10-CM | POA: Diagnosis not present

## 2016-07-14 DIAGNOSIS — E8809 Other disorders of plasma-protein metabolism, not elsewhere classified: Secondary | ICD-10-CM | POA: Diagnosis not present

## 2016-07-15 DIAGNOSIS — C712 Malignant neoplasm of temporal lobe: Secondary | ICD-10-CM | POA: Diagnosis not present

## 2016-07-15 DIAGNOSIS — D649 Anemia, unspecified: Secondary | ICD-10-CM | POA: Diagnosis not present

## 2016-07-15 DIAGNOSIS — I2699 Other pulmonary embolism without acute cor pulmonale: Secondary | ICD-10-CM | POA: Diagnosis not present

## 2016-07-15 DIAGNOSIS — J9621 Acute and chronic respiratory failure with hypoxia: Secondary | ICD-10-CM | POA: Diagnosis not present

## 2016-07-15 DIAGNOSIS — K922 Gastrointestinal hemorrhage, unspecified: Secondary | ICD-10-CM | POA: Diagnosis not present

## 2016-07-15 DIAGNOSIS — I82401 Acute embolism and thrombosis of unspecified deep veins of right lower extremity: Secondary | ICD-10-CM | POA: Diagnosis not present

## 2016-07-15 DIAGNOSIS — E8809 Other disorders of plasma-protein metabolism, not elsewhere classified: Secondary | ICD-10-CM | POA: Diagnosis not present

## 2016-07-15 DIAGNOSIS — Z9981 Dependence on supplemental oxygen: Secondary | ICD-10-CM | POA: Diagnosis not present

## 2016-07-15 DIAGNOSIS — E43 Unspecified severe protein-calorie malnutrition: Secondary | ICD-10-CM | POA: Diagnosis not present

## 2016-07-16 DIAGNOSIS — E8809 Other disorders of plasma-protein metabolism, not elsewhere classified: Secondary | ICD-10-CM | POA: Diagnosis not present

## 2016-07-16 DIAGNOSIS — I2699 Other pulmonary embolism without acute cor pulmonale: Secondary | ICD-10-CM | POA: Diagnosis not present

## 2016-07-16 DIAGNOSIS — E43 Unspecified severe protein-calorie malnutrition: Secondary | ICD-10-CM | POA: Diagnosis not present

## 2016-07-16 DIAGNOSIS — C712 Malignant neoplasm of temporal lobe: Secondary | ICD-10-CM | POA: Diagnosis not present

## 2016-07-16 DIAGNOSIS — K922 Gastrointestinal hemorrhage, unspecified: Secondary | ICD-10-CM | POA: Diagnosis not present

## 2016-07-16 DIAGNOSIS — Z9981 Dependence on supplemental oxygen: Secondary | ICD-10-CM | POA: Diagnosis not present

## 2016-07-16 DIAGNOSIS — J9621 Acute and chronic respiratory failure with hypoxia: Secondary | ICD-10-CM | POA: Diagnosis not present

## 2016-07-16 DIAGNOSIS — I82401 Acute embolism and thrombosis of unspecified deep veins of right lower extremity: Secondary | ICD-10-CM | POA: Diagnosis not present

## 2016-07-16 DIAGNOSIS — D649 Anemia, unspecified: Secondary | ICD-10-CM | POA: Diagnosis not present

## 2016-07-17 DIAGNOSIS — J9621 Acute and chronic respiratory failure with hypoxia: Secondary | ICD-10-CM | POA: Diagnosis not present

## 2016-07-17 DIAGNOSIS — Z9981 Dependence on supplemental oxygen: Secondary | ICD-10-CM | POA: Diagnosis not present

## 2016-07-17 DIAGNOSIS — I2699 Other pulmonary embolism without acute cor pulmonale: Secondary | ICD-10-CM | POA: Diagnosis not present

## 2016-07-17 DIAGNOSIS — E8809 Other disorders of plasma-protein metabolism, not elsewhere classified: Secondary | ICD-10-CM | POA: Diagnosis not present

## 2016-07-17 DIAGNOSIS — C712 Malignant neoplasm of temporal lobe: Secondary | ICD-10-CM | POA: Diagnosis not present

## 2016-07-17 DIAGNOSIS — D649 Anemia, unspecified: Secondary | ICD-10-CM | POA: Diagnosis not present

## 2016-07-17 DIAGNOSIS — I82401 Acute embolism and thrombosis of unspecified deep veins of right lower extremity: Secondary | ICD-10-CM | POA: Diagnosis not present

## 2016-07-17 DIAGNOSIS — K922 Gastrointestinal hemorrhage, unspecified: Secondary | ICD-10-CM | POA: Diagnosis not present

## 2016-07-17 DIAGNOSIS — E43 Unspecified severe protein-calorie malnutrition: Secondary | ICD-10-CM | POA: Diagnosis not present

## 2016-07-18 DIAGNOSIS — J9621 Acute and chronic respiratory failure with hypoxia: Secondary | ICD-10-CM | POA: Diagnosis not present

## 2016-07-18 DIAGNOSIS — E43 Unspecified severe protein-calorie malnutrition: Secondary | ICD-10-CM | POA: Diagnosis not present

## 2016-07-18 DIAGNOSIS — C712 Malignant neoplasm of temporal lobe: Secondary | ICD-10-CM | POA: Diagnosis not present

## 2016-07-18 DIAGNOSIS — I82401 Acute embolism and thrombosis of unspecified deep veins of right lower extremity: Secondary | ICD-10-CM | POA: Diagnosis not present

## 2016-07-18 DIAGNOSIS — I2699 Other pulmonary embolism without acute cor pulmonale: Secondary | ICD-10-CM | POA: Diagnosis not present

## 2016-07-18 DIAGNOSIS — D649 Anemia, unspecified: Secondary | ICD-10-CM | POA: Diagnosis not present

## 2016-07-18 DIAGNOSIS — Z9981 Dependence on supplemental oxygen: Secondary | ICD-10-CM | POA: Diagnosis not present

## 2016-07-18 DIAGNOSIS — E8809 Other disorders of plasma-protein metabolism, not elsewhere classified: Secondary | ICD-10-CM | POA: Diagnosis not present

## 2016-07-18 DIAGNOSIS — K922 Gastrointestinal hemorrhage, unspecified: Secondary | ICD-10-CM | POA: Diagnosis not present

## 2016-07-19 DIAGNOSIS — Z9981 Dependence on supplemental oxygen: Secondary | ICD-10-CM | POA: Diagnosis not present

## 2016-07-19 DIAGNOSIS — D649 Anemia, unspecified: Secondary | ICD-10-CM | POA: Diagnosis not present

## 2016-07-19 DIAGNOSIS — K922 Gastrointestinal hemorrhage, unspecified: Secondary | ICD-10-CM | POA: Diagnosis not present

## 2016-07-19 DIAGNOSIS — I82401 Acute embolism and thrombosis of unspecified deep veins of right lower extremity: Secondary | ICD-10-CM | POA: Diagnosis not present

## 2016-07-19 DIAGNOSIS — E43 Unspecified severe protein-calorie malnutrition: Secondary | ICD-10-CM | POA: Diagnosis not present

## 2016-07-19 DIAGNOSIS — C712 Malignant neoplasm of temporal lobe: Secondary | ICD-10-CM | POA: Diagnosis not present

## 2016-07-19 DIAGNOSIS — E8809 Other disorders of plasma-protein metabolism, not elsewhere classified: Secondary | ICD-10-CM | POA: Diagnosis not present

## 2016-07-19 DIAGNOSIS — I2699 Other pulmonary embolism without acute cor pulmonale: Secondary | ICD-10-CM | POA: Diagnosis not present

## 2016-07-19 DIAGNOSIS — J9621 Acute and chronic respiratory failure with hypoxia: Secondary | ICD-10-CM | POA: Diagnosis not present

## 2016-07-20 DIAGNOSIS — I2699 Other pulmonary embolism without acute cor pulmonale: Secondary | ICD-10-CM | POA: Diagnosis not present

## 2016-07-20 DIAGNOSIS — Z9981 Dependence on supplemental oxygen: Secondary | ICD-10-CM | POA: Diagnosis not present

## 2016-07-20 DIAGNOSIS — C712 Malignant neoplasm of temporal lobe: Secondary | ICD-10-CM | POA: Diagnosis not present

## 2016-07-20 DIAGNOSIS — E8809 Other disorders of plasma-protein metabolism, not elsewhere classified: Secondary | ICD-10-CM | POA: Diagnosis not present

## 2016-07-20 DIAGNOSIS — E43 Unspecified severe protein-calorie malnutrition: Secondary | ICD-10-CM | POA: Diagnosis not present

## 2016-07-20 DIAGNOSIS — K922 Gastrointestinal hemorrhage, unspecified: Secondary | ICD-10-CM | POA: Diagnosis not present

## 2016-07-20 DIAGNOSIS — I82401 Acute embolism and thrombosis of unspecified deep veins of right lower extremity: Secondary | ICD-10-CM | POA: Diagnosis not present

## 2016-07-20 DIAGNOSIS — D649 Anemia, unspecified: Secondary | ICD-10-CM | POA: Diagnosis not present

## 2016-07-20 DIAGNOSIS — J9621 Acute and chronic respiratory failure with hypoxia: Secondary | ICD-10-CM | POA: Diagnosis not present

## 2016-07-21 DIAGNOSIS — D649 Anemia, unspecified: Secondary | ICD-10-CM | POA: Diagnosis not present

## 2016-07-21 DIAGNOSIS — J9621 Acute and chronic respiratory failure with hypoxia: Secondary | ICD-10-CM | POA: Diagnosis not present

## 2016-07-21 DIAGNOSIS — E43 Unspecified severe protein-calorie malnutrition: Secondary | ICD-10-CM | POA: Diagnosis not present

## 2016-07-21 DIAGNOSIS — K922 Gastrointestinal hemorrhage, unspecified: Secondary | ICD-10-CM | POA: Diagnosis not present

## 2016-07-21 DIAGNOSIS — E8809 Other disorders of plasma-protein metabolism, not elsewhere classified: Secondary | ICD-10-CM | POA: Diagnosis not present

## 2016-07-21 DIAGNOSIS — Z9981 Dependence on supplemental oxygen: Secondary | ICD-10-CM | POA: Diagnosis not present

## 2016-07-21 DIAGNOSIS — C712 Malignant neoplasm of temporal lobe: Secondary | ICD-10-CM | POA: Diagnosis not present

## 2016-07-21 DIAGNOSIS — I82401 Acute embolism and thrombosis of unspecified deep veins of right lower extremity: Secondary | ICD-10-CM | POA: Diagnosis not present

## 2016-07-21 DIAGNOSIS — I2699 Other pulmonary embolism without acute cor pulmonale: Secondary | ICD-10-CM | POA: Diagnosis not present

## 2016-07-22 DIAGNOSIS — I82401 Acute embolism and thrombosis of unspecified deep veins of right lower extremity: Secondary | ICD-10-CM | POA: Diagnosis not present

## 2016-07-22 DIAGNOSIS — I2699 Other pulmonary embolism without acute cor pulmonale: Secondary | ICD-10-CM | POA: Diagnosis not present

## 2016-07-22 DIAGNOSIS — K922 Gastrointestinal hemorrhage, unspecified: Secondary | ICD-10-CM | POA: Diagnosis not present

## 2016-07-22 DIAGNOSIS — Z9981 Dependence on supplemental oxygen: Secondary | ICD-10-CM | POA: Diagnosis not present

## 2016-07-22 DIAGNOSIS — E43 Unspecified severe protein-calorie malnutrition: Secondary | ICD-10-CM | POA: Diagnosis not present

## 2016-07-22 DIAGNOSIS — C712 Malignant neoplasm of temporal lobe: Secondary | ICD-10-CM | POA: Diagnosis not present

## 2016-07-22 DIAGNOSIS — E8809 Other disorders of plasma-protein metabolism, not elsewhere classified: Secondary | ICD-10-CM | POA: Diagnosis not present

## 2016-07-22 DIAGNOSIS — D649 Anemia, unspecified: Secondary | ICD-10-CM | POA: Diagnosis not present

## 2016-07-22 DIAGNOSIS — J9621 Acute and chronic respiratory failure with hypoxia: Secondary | ICD-10-CM | POA: Diagnosis not present

## 2016-07-23 DIAGNOSIS — K922 Gastrointestinal hemorrhage, unspecified: Secondary | ICD-10-CM | POA: Diagnosis not present

## 2016-07-23 DIAGNOSIS — I2699 Other pulmonary embolism without acute cor pulmonale: Secondary | ICD-10-CM | POA: Diagnosis not present

## 2016-07-23 DIAGNOSIS — E8809 Other disorders of plasma-protein metabolism, not elsewhere classified: Secondary | ICD-10-CM | POA: Diagnosis not present

## 2016-07-23 DIAGNOSIS — Z9981 Dependence on supplemental oxygen: Secondary | ICD-10-CM | POA: Diagnosis not present

## 2016-07-23 DIAGNOSIS — D649 Anemia, unspecified: Secondary | ICD-10-CM | POA: Diagnosis not present

## 2016-07-23 DIAGNOSIS — I82401 Acute embolism and thrombosis of unspecified deep veins of right lower extremity: Secondary | ICD-10-CM | POA: Diagnosis not present

## 2016-07-23 DIAGNOSIS — J9621 Acute and chronic respiratory failure with hypoxia: Secondary | ICD-10-CM | POA: Diagnosis not present

## 2016-07-23 DIAGNOSIS — C712 Malignant neoplasm of temporal lobe: Secondary | ICD-10-CM | POA: Diagnosis not present

## 2016-07-23 DIAGNOSIS — E43 Unspecified severe protein-calorie malnutrition: Secondary | ICD-10-CM | POA: Diagnosis not present

## 2016-07-24 DIAGNOSIS — E8809 Other disorders of plasma-protein metabolism, not elsewhere classified: Secondary | ICD-10-CM | POA: Diagnosis not present

## 2016-07-24 DIAGNOSIS — D649 Anemia, unspecified: Secondary | ICD-10-CM | POA: Diagnosis not present

## 2016-07-24 DIAGNOSIS — Z9981 Dependence on supplemental oxygen: Secondary | ICD-10-CM | POA: Diagnosis not present

## 2016-07-24 DIAGNOSIS — I82401 Acute embolism and thrombosis of unspecified deep veins of right lower extremity: Secondary | ICD-10-CM | POA: Diagnosis not present

## 2016-07-24 DIAGNOSIS — C712 Malignant neoplasm of temporal lobe: Secondary | ICD-10-CM | POA: Diagnosis not present

## 2016-07-24 DIAGNOSIS — J9621 Acute and chronic respiratory failure with hypoxia: Secondary | ICD-10-CM | POA: Diagnosis not present

## 2016-07-24 DIAGNOSIS — I2699 Other pulmonary embolism without acute cor pulmonale: Secondary | ICD-10-CM | POA: Diagnosis not present

## 2016-07-24 DIAGNOSIS — K922 Gastrointestinal hemorrhage, unspecified: Secondary | ICD-10-CM | POA: Diagnosis not present

## 2016-07-24 DIAGNOSIS — E43 Unspecified severe protein-calorie malnutrition: Secondary | ICD-10-CM | POA: Diagnosis not present

## 2016-07-25 DIAGNOSIS — E8809 Other disorders of plasma-protein metabolism, not elsewhere classified: Secondary | ICD-10-CM | POA: Diagnosis not present

## 2016-07-25 DIAGNOSIS — I2699 Other pulmonary embolism without acute cor pulmonale: Secondary | ICD-10-CM | POA: Diagnosis not present

## 2016-07-25 DIAGNOSIS — Z9981 Dependence on supplemental oxygen: Secondary | ICD-10-CM | POA: Diagnosis not present

## 2016-07-25 DIAGNOSIS — E43 Unspecified severe protein-calorie malnutrition: Secondary | ICD-10-CM | POA: Diagnosis not present

## 2016-07-25 DIAGNOSIS — I82401 Acute embolism and thrombosis of unspecified deep veins of right lower extremity: Secondary | ICD-10-CM | POA: Diagnosis not present

## 2016-07-25 DIAGNOSIS — D649 Anemia, unspecified: Secondary | ICD-10-CM | POA: Diagnosis not present

## 2016-07-25 DIAGNOSIS — K922 Gastrointestinal hemorrhage, unspecified: Secondary | ICD-10-CM | POA: Diagnosis not present

## 2016-07-25 DIAGNOSIS — J9621 Acute and chronic respiratory failure with hypoxia: Secondary | ICD-10-CM | POA: Diagnosis not present

## 2016-07-25 DIAGNOSIS — C712 Malignant neoplasm of temporal lobe: Secondary | ICD-10-CM | POA: Diagnosis not present

## 2016-07-26 DIAGNOSIS — J9621 Acute and chronic respiratory failure with hypoxia: Secondary | ICD-10-CM | POA: Diagnosis not present

## 2016-07-26 DIAGNOSIS — E43 Unspecified severe protein-calorie malnutrition: Secondary | ICD-10-CM | POA: Diagnosis not present

## 2016-07-26 DIAGNOSIS — I82401 Acute embolism and thrombosis of unspecified deep veins of right lower extremity: Secondary | ICD-10-CM | POA: Diagnosis not present

## 2016-07-26 DIAGNOSIS — Z9981 Dependence on supplemental oxygen: Secondary | ICD-10-CM | POA: Diagnosis not present

## 2016-07-26 DIAGNOSIS — E8809 Other disorders of plasma-protein metabolism, not elsewhere classified: Secondary | ICD-10-CM | POA: Diagnosis not present

## 2016-07-26 DIAGNOSIS — C712 Malignant neoplasm of temporal lobe: Secondary | ICD-10-CM | POA: Diagnosis not present

## 2016-07-26 DIAGNOSIS — D649 Anemia, unspecified: Secondary | ICD-10-CM | POA: Diagnosis not present

## 2016-07-26 DIAGNOSIS — K922 Gastrointestinal hemorrhage, unspecified: Secondary | ICD-10-CM | POA: Diagnosis not present

## 2016-07-26 DIAGNOSIS — I2699 Other pulmonary embolism without acute cor pulmonale: Secondary | ICD-10-CM | POA: Diagnosis not present

## 2016-07-27 DIAGNOSIS — I2699 Other pulmonary embolism without acute cor pulmonale: Secondary | ICD-10-CM | POA: Diagnosis not present

## 2016-07-27 DIAGNOSIS — Z9981 Dependence on supplemental oxygen: Secondary | ICD-10-CM | POA: Diagnosis not present

## 2016-07-27 DIAGNOSIS — E43 Unspecified severe protein-calorie malnutrition: Secondary | ICD-10-CM | POA: Diagnosis not present

## 2016-07-27 DIAGNOSIS — K922 Gastrointestinal hemorrhage, unspecified: Secondary | ICD-10-CM | POA: Diagnosis not present

## 2016-07-27 DIAGNOSIS — D649 Anemia, unspecified: Secondary | ICD-10-CM | POA: Diagnosis not present

## 2016-07-27 DIAGNOSIS — C712 Malignant neoplasm of temporal lobe: Secondary | ICD-10-CM | POA: Diagnosis not present

## 2016-07-27 DIAGNOSIS — E8809 Other disorders of plasma-protein metabolism, not elsewhere classified: Secondary | ICD-10-CM | POA: Diagnosis not present

## 2016-07-27 DIAGNOSIS — I82401 Acute embolism and thrombosis of unspecified deep veins of right lower extremity: Secondary | ICD-10-CM | POA: Diagnosis not present

## 2016-07-27 DIAGNOSIS — J9621 Acute and chronic respiratory failure with hypoxia: Secondary | ICD-10-CM | POA: Diagnosis not present

## 2016-07-28 DIAGNOSIS — K922 Gastrointestinal hemorrhage, unspecified: Secondary | ICD-10-CM | POA: Diagnosis not present

## 2016-07-28 DIAGNOSIS — J9621 Acute and chronic respiratory failure with hypoxia: Secondary | ICD-10-CM | POA: Diagnosis not present

## 2016-07-28 DIAGNOSIS — I2699 Other pulmonary embolism without acute cor pulmonale: Secondary | ICD-10-CM | POA: Diagnosis not present

## 2016-07-28 DIAGNOSIS — E8809 Other disorders of plasma-protein metabolism, not elsewhere classified: Secondary | ICD-10-CM | POA: Diagnosis not present

## 2016-07-28 DIAGNOSIS — Z9981 Dependence on supplemental oxygen: Secondary | ICD-10-CM | POA: Diagnosis not present

## 2016-07-28 DIAGNOSIS — D649 Anemia, unspecified: Secondary | ICD-10-CM | POA: Diagnosis not present

## 2016-07-28 DIAGNOSIS — I82401 Acute embolism and thrombosis of unspecified deep veins of right lower extremity: Secondary | ICD-10-CM | POA: Diagnosis not present

## 2016-07-28 DIAGNOSIS — C712 Malignant neoplasm of temporal lobe: Secondary | ICD-10-CM | POA: Diagnosis not present

## 2016-07-28 DIAGNOSIS — E43 Unspecified severe protein-calorie malnutrition: Secondary | ICD-10-CM | POA: Diagnosis not present

## 2016-07-29 DIAGNOSIS — Z9981 Dependence on supplemental oxygen: Secondary | ICD-10-CM | POA: Diagnosis not present

## 2016-07-29 DIAGNOSIS — D649 Anemia, unspecified: Secondary | ICD-10-CM | POA: Diagnosis not present

## 2016-07-29 DIAGNOSIS — C712 Malignant neoplasm of temporal lobe: Secondary | ICD-10-CM | POA: Diagnosis not present

## 2016-07-29 DIAGNOSIS — J9621 Acute and chronic respiratory failure with hypoxia: Secondary | ICD-10-CM | POA: Diagnosis not present

## 2016-07-29 DIAGNOSIS — K922 Gastrointestinal hemorrhage, unspecified: Secondary | ICD-10-CM | POA: Diagnosis not present

## 2016-07-29 DIAGNOSIS — E8809 Other disorders of plasma-protein metabolism, not elsewhere classified: Secondary | ICD-10-CM | POA: Diagnosis not present

## 2016-07-29 DIAGNOSIS — I2699 Other pulmonary embolism without acute cor pulmonale: Secondary | ICD-10-CM | POA: Diagnosis not present

## 2016-07-29 DIAGNOSIS — E43 Unspecified severe protein-calorie malnutrition: Secondary | ICD-10-CM | POA: Diagnosis not present

## 2016-07-29 DIAGNOSIS — I82401 Acute embolism and thrombosis of unspecified deep veins of right lower extremity: Secondary | ICD-10-CM | POA: Diagnosis not present

## 2016-07-30 DIAGNOSIS — I82401 Acute embolism and thrombosis of unspecified deep veins of right lower extremity: Secondary | ICD-10-CM | POA: Diagnosis not present

## 2016-07-30 DIAGNOSIS — C712 Malignant neoplasm of temporal lobe: Secondary | ICD-10-CM | POA: Diagnosis not present

## 2016-07-30 DIAGNOSIS — J9621 Acute and chronic respiratory failure with hypoxia: Secondary | ICD-10-CM | POA: Diagnosis not present

## 2016-07-30 DIAGNOSIS — I2699 Other pulmonary embolism without acute cor pulmonale: Secondary | ICD-10-CM | POA: Diagnosis not present

## 2016-07-30 DIAGNOSIS — E8809 Other disorders of plasma-protein metabolism, not elsewhere classified: Secondary | ICD-10-CM | POA: Diagnosis not present

## 2016-07-30 DIAGNOSIS — Z9981 Dependence on supplemental oxygen: Secondary | ICD-10-CM | POA: Diagnosis not present

## 2016-07-30 DIAGNOSIS — D649 Anemia, unspecified: Secondary | ICD-10-CM | POA: Diagnosis not present

## 2016-07-30 DIAGNOSIS — E43 Unspecified severe protein-calorie malnutrition: Secondary | ICD-10-CM | POA: Diagnosis not present

## 2016-07-30 DIAGNOSIS — K922 Gastrointestinal hemorrhage, unspecified: Secondary | ICD-10-CM | POA: Diagnosis not present

## 2016-07-31 DIAGNOSIS — C712 Malignant neoplasm of temporal lobe: Secondary | ICD-10-CM | POA: Diagnosis not present

## 2016-07-31 DIAGNOSIS — Z9981 Dependence on supplemental oxygen: Secondary | ICD-10-CM | POA: Diagnosis not present

## 2016-07-31 DIAGNOSIS — I2699 Other pulmonary embolism without acute cor pulmonale: Secondary | ICD-10-CM | POA: Diagnosis not present

## 2016-07-31 DIAGNOSIS — I82401 Acute embolism and thrombosis of unspecified deep veins of right lower extremity: Secondary | ICD-10-CM | POA: Diagnosis not present

## 2016-07-31 DIAGNOSIS — E8809 Other disorders of plasma-protein metabolism, not elsewhere classified: Secondary | ICD-10-CM | POA: Diagnosis not present

## 2016-07-31 DIAGNOSIS — E43 Unspecified severe protein-calorie malnutrition: Secondary | ICD-10-CM | POA: Diagnosis not present

## 2016-07-31 DIAGNOSIS — J9621 Acute and chronic respiratory failure with hypoxia: Secondary | ICD-10-CM | POA: Diagnosis not present

## 2016-07-31 DIAGNOSIS — D649 Anemia, unspecified: Secondary | ICD-10-CM | POA: Diagnosis not present

## 2016-07-31 DIAGNOSIS — K922 Gastrointestinal hemorrhage, unspecified: Secondary | ICD-10-CM | POA: Diagnosis not present

## 2016-08-01 DIAGNOSIS — D649 Anemia, unspecified: Secondary | ICD-10-CM | POA: Diagnosis not present

## 2016-08-01 DIAGNOSIS — K922 Gastrointestinal hemorrhage, unspecified: Secondary | ICD-10-CM | POA: Diagnosis not present

## 2016-08-01 DIAGNOSIS — E43 Unspecified severe protein-calorie malnutrition: Secondary | ICD-10-CM | POA: Diagnosis not present

## 2016-08-01 DIAGNOSIS — E8809 Other disorders of plasma-protein metabolism, not elsewhere classified: Secondary | ICD-10-CM | POA: Diagnosis not present

## 2016-08-01 DIAGNOSIS — J9621 Acute and chronic respiratory failure with hypoxia: Secondary | ICD-10-CM | POA: Diagnosis not present

## 2016-08-01 DIAGNOSIS — I82401 Acute embolism and thrombosis of unspecified deep veins of right lower extremity: Secondary | ICD-10-CM | POA: Diagnosis not present

## 2016-08-01 DIAGNOSIS — Z9981 Dependence on supplemental oxygen: Secondary | ICD-10-CM | POA: Diagnosis not present

## 2016-08-01 DIAGNOSIS — C712 Malignant neoplasm of temporal lobe: Secondary | ICD-10-CM | POA: Diagnosis not present

## 2016-08-01 DIAGNOSIS — I2699 Other pulmonary embolism without acute cor pulmonale: Secondary | ICD-10-CM | POA: Diagnosis not present

## 2016-08-02 DIAGNOSIS — D649 Anemia, unspecified: Secondary | ICD-10-CM | POA: Diagnosis not present

## 2016-08-02 DIAGNOSIS — E43 Unspecified severe protein-calorie malnutrition: Secondary | ICD-10-CM | POA: Diagnosis not present

## 2016-08-02 DIAGNOSIS — K922 Gastrointestinal hemorrhage, unspecified: Secondary | ICD-10-CM | POA: Diagnosis not present

## 2016-08-02 DIAGNOSIS — C712 Malignant neoplasm of temporal lobe: Secondary | ICD-10-CM | POA: Diagnosis not present

## 2016-08-02 DIAGNOSIS — E8809 Other disorders of plasma-protein metabolism, not elsewhere classified: Secondary | ICD-10-CM | POA: Diagnosis not present

## 2016-08-02 DIAGNOSIS — Z9981 Dependence on supplemental oxygen: Secondary | ICD-10-CM | POA: Diagnosis not present

## 2016-08-02 DIAGNOSIS — I2699 Other pulmonary embolism without acute cor pulmonale: Secondary | ICD-10-CM | POA: Diagnosis not present

## 2016-08-02 DIAGNOSIS — I82401 Acute embolism and thrombosis of unspecified deep veins of right lower extremity: Secondary | ICD-10-CM | POA: Diagnosis not present

## 2016-08-02 DIAGNOSIS — J9621 Acute and chronic respiratory failure with hypoxia: Secondary | ICD-10-CM | POA: Diagnosis not present

## 2016-08-03 DIAGNOSIS — C712 Malignant neoplasm of temporal lobe: Secondary | ICD-10-CM | POA: Diagnosis not present

## 2016-08-03 DIAGNOSIS — K922 Gastrointestinal hemorrhage, unspecified: Secondary | ICD-10-CM | POA: Diagnosis not present

## 2016-08-03 DIAGNOSIS — E8809 Other disorders of plasma-protein metabolism, not elsewhere classified: Secondary | ICD-10-CM | POA: Diagnosis not present

## 2016-08-03 DIAGNOSIS — Z9981 Dependence on supplemental oxygen: Secondary | ICD-10-CM | POA: Diagnosis not present

## 2016-08-03 DIAGNOSIS — D649 Anemia, unspecified: Secondary | ICD-10-CM | POA: Diagnosis not present

## 2016-08-03 DIAGNOSIS — E43 Unspecified severe protein-calorie malnutrition: Secondary | ICD-10-CM | POA: Diagnosis not present

## 2016-08-03 DIAGNOSIS — I82401 Acute embolism and thrombosis of unspecified deep veins of right lower extremity: Secondary | ICD-10-CM | POA: Diagnosis not present

## 2016-08-03 DIAGNOSIS — J9621 Acute and chronic respiratory failure with hypoxia: Secondary | ICD-10-CM | POA: Diagnosis not present

## 2016-08-03 DIAGNOSIS — I2699 Other pulmonary embolism without acute cor pulmonale: Secondary | ICD-10-CM | POA: Diagnosis not present

## 2016-08-04 DIAGNOSIS — J9621 Acute and chronic respiratory failure with hypoxia: Secondary | ICD-10-CM | POA: Diagnosis not present

## 2016-08-04 DIAGNOSIS — I2699 Other pulmonary embolism without acute cor pulmonale: Secondary | ICD-10-CM | POA: Diagnosis not present

## 2016-08-04 DIAGNOSIS — C712 Malignant neoplasm of temporal lobe: Secondary | ICD-10-CM | POA: Diagnosis not present

## 2016-08-04 DIAGNOSIS — I82401 Acute embolism and thrombosis of unspecified deep veins of right lower extremity: Secondary | ICD-10-CM | POA: Diagnosis not present

## 2016-08-04 DIAGNOSIS — K922 Gastrointestinal hemorrhage, unspecified: Secondary | ICD-10-CM | POA: Diagnosis not present

## 2016-08-05 DIAGNOSIS — I2699 Other pulmonary embolism without acute cor pulmonale: Secondary | ICD-10-CM | POA: Diagnosis not present

## 2016-08-05 DIAGNOSIS — J9621 Acute and chronic respiratory failure with hypoxia: Secondary | ICD-10-CM | POA: Diagnosis not present

## 2016-08-05 DIAGNOSIS — I82401 Acute embolism and thrombosis of unspecified deep veins of right lower extremity: Secondary | ICD-10-CM | POA: Diagnosis not present

## 2016-08-05 DIAGNOSIS — K922 Gastrointestinal hemorrhage, unspecified: Secondary | ICD-10-CM | POA: Diagnosis not present

## 2016-08-05 DIAGNOSIS — C712 Malignant neoplasm of temporal lobe: Secondary | ICD-10-CM | POA: Diagnosis not present

## 2016-08-06 DIAGNOSIS — I82401 Acute embolism and thrombosis of unspecified deep veins of right lower extremity: Secondary | ICD-10-CM | POA: Diagnosis not present

## 2016-08-06 DIAGNOSIS — C712 Malignant neoplasm of temporal lobe: Secondary | ICD-10-CM | POA: Diagnosis not present

## 2016-08-06 DIAGNOSIS — J9621 Acute and chronic respiratory failure with hypoxia: Secondary | ICD-10-CM | POA: Diagnosis not present

## 2016-08-06 DIAGNOSIS — I2699 Other pulmonary embolism without acute cor pulmonale: Secondary | ICD-10-CM | POA: Diagnosis not present

## 2016-08-06 DIAGNOSIS — K922 Gastrointestinal hemorrhage, unspecified: Secondary | ICD-10-CM | POA: Diagnosis not present

## 2016-08-07 DIAGNOSIS — I2699 Other pulmonary embolism without acute cor pulmonale: Secondary | ICD-10-CM | POA: Diagnosis not present

## 2016-08-07 DIAGNOSIS — J9621 Acute and chronic respiratory failure with hypoxia: Secondary | ICD-10-CM | POA: Diagnosis not present

## 2016-08-07 DIAGNOSIS — C712 Malignant neoplasm of temporal lobe: Secondary | ICD-10-CM | POA: Diagnosis not present

## 2016-08-07 DIAGNOSIS — K922 Gastrointestinal hemorrhage, unspecified: Secondary | ICD-10-CM | POA: Diagnosis not present

## 2016-08-07 DIAGNOSIS — I82401 Acute embolism and thrombosis of unspecified deep veins of right lower extremity: Secondary | ICD-10-CM | POA: Diagnosis not present

## 2016-08-08 DIAGNOSIS — C712 Malignant neoplasm of temporal lobe: Secondary | ICD-10-CM | POA: Diagnosis not present

## 2016-08-08 DIAGNOSIS — I82401 Acute embolism and thrombosis of unspecified deep veins of right lower extremity: Secondary | ICD-10-CM | POA: Diagnosis not present

## 2016-08-08 DIAGNOSIS — J9621 Acute and chronic respiratory failure with hypoxia: Secondary | ICD-10-CM | POA: Diagnosis not present

## 2016-08-08 DIAGNOSIS — I2699 Other pulmonary embolism without acute cor pulmonale: Secondary | ICD-10-CM | POA: Diagnosis not present

## 2016-08-08 DIAGNOSIS — K922 Gastrointestinal hemorrhage, unspecified: Secondary | ICD-10-CM | POA: Diagnosis not present

## 2016-08-09 DIAGNOSIS — I2699 Other pulmonary embolism without acute cor pulmonale: Secondary | ICD-10-CM | POA: Diagnosis not present

## 2016-08-09 DIAGNOSIS — J9621 Acute and chronic respiratory failure with hypoxia: Secondary | ICD-10-CM | POA: Diagnosis not present

## 2016-08-09 DIAGNOSIS — K922 Gastrointestinal hemorrhage, unspecified: Secondary | ICD-10-CM | POA: Diagnosis not present

## 2016-08-09 DIAGNOSIS — C712 Malignant neoplasm of temporal lobe: Secondary | ICD-10-CM | POA: Diagnosis not present

## 2016-08-09 DIAGNOSIS — I82401 Acute embolism and thrombosis of unspecified deep veins of right lower extremity: Secondary | ICD-10-CM | POA: Diagnosis not present

## 2016-08-10 DIAGNOSIS — I82401 Acute embolism and thrombosis of unspecified deep veins of right lower extremity: Secondary | ICD-10-CM | POA: Diagnosis not present

## 2016-08-10 DIAGNOSIS — K922 Gastrointestinal hemorrhage, unspecified: Secondary | ICD-10-CM | POA: Diagnosis not present

## 2016-08-10 DIAGNOSIS — C712 Malignant neoplasm of temporal lobe: Secondary | ICD-10-CM | POA: Diagnosis not present

## 2016-08-10 DIAGNOSIS — J9621 Acute and chronic respiratory failure with hypoxia: Secondary | ICD-10-CM | POA: Diagnosis not present

## 2016-08-10 DIAGNOSIS — I2699 Other pulmonary embolism without acute cor pulmonale: Secondary | ICD-10-CM | POA: Diagnosis not present

## 2016-08-11 DIAGNOSIS — C712 Malignant neoplasm of temporal lobe: Secondary | ICD-10-CM | POA: Diagnosis not present

## 2016-08-11 DIAGNOSIS — K922 Gastrointestinal hemorrhage, unspecified: Secondary | ICD-10-CM | POA: Diagnosis not present

## 2016-08-11 DIAGNOSIS — J9621 Acute and chronic respiratory failure with hypoxia: Secondary | ICD-10-CM | POA: Diagnosis not present

## 2016-08-11 DIAGNOSIS — I2699 Other pulmonary embolism without acute cor pulmonale: Secondary | ICD-10-CM | POA: Diagnosis not present

## 2016-08-11 DIAGNOSIS — I82401 Acute embolism and thrombosis of unspecified deep veins of right lower extremity: Secondary | ICD-10-CM | POA: Diagnosis not present

## 2016-08-12 DIAGNOSIS — J9621 Acute and chronic respiratory failure with hypoxia: Secondary | ICD-10-CM | POA: Diagnosis not present

## 2016-08-12 DIAGNOSIS — I2699 Other pulmonary embolism without acute cor pulmonale: Secondary | ICD-10-CM | POA: Diagnosis not present

## 2016-08-12 DIAGNOSIS — C712 Malignant neoplasm of temporal lobe: Secondary | ICD-10-CM | POA: Diagnosis not present

## 2016-08-12 DIAGNOSIS — K922 Gastrointestinal hemorrhage, unspecified: Secondary | ICD-10-CM | POA: Diagnosis not present

## 2016-08-12 DIAGNOSIS — I82401 Acute embolism and thrombosis of unspecified deep veins of right lower extremity: Secondary | ICD-10-CM | POA: Diagnosis not present

## 2016-08-13 DIAGNOSIS — I82401 Acute embolism and thrombosis of unspecified deep veins of right lower extremity: Secondary | ICD-10-CM | POA: Diagnosis not present

## 2016-08-13 DIAGNOSIS — K922 Gastrointestinal hemorrhage, unspecified: Secondary | ICD-10-CM | POA: Diagnosis not present

## 2016-08-13 DIAGNOSIS — J9621 Acute and chronic respiratory failure with hypoxia: Secondary | ICD-10-CM | POA: Diagnosis not present

## 2016-08-13 DIAGNOSIS — I2699 Other pulmonary embolism without acute cor pulmonale: Secondary | ICD-10-CM | POA: Diagnosis not present

## 2016-08-13 DIAGNOSIS — C712 Malignant neoplasm of temporal lobe: Secondary | ICD-10-CM | POA: Diagnosis not present

## 2016-08-14 DIAGNOSIS — J9621 Acute and chronic respiratory failure with hypoxia: Secondary | ICD-10-CM | POA: Diagnosis not present

## 2016-08-14 DIAGNOSIS — K922 Gastrointestinal hemorrhage, unspecified: Secondary | ICD-10-CM | POA: Diagnosis not present

## 2016-08-14 DIAGNOSIS — I2699 Other pulmonary embolism without acute cor pulmonale: Secondary | ICD-10-CM | POA: Diagnosis not present

## 2016-08-14 DIAGNOSIS — I82401 Acute embolism and thrombosis of unspecified deep veins of right lower extremity: Secondary | ICD-10-CM | POA: Diagnosis not present

## 2016-08-14 DIAGNOSIS — C712 Malignant neoplasm of temporal lobe: Secondary | ICD-10-CM | POA: Diagnosis not present

## 2016-08-15 DIAGNOSIS — J9621 Acute and chronic respiratory failure with hypoxia: Secondary | ICD-10-CM | POA: Diagnosis not present

## 2016-08-15 DIAGNOSIS — I2699 Other pulmonary embolism without acute cor pulmonale: Secondary | ICD-10-CM | POA: Diagnosis not present

## 2016-08-15 DIAGNOSIS — I82401 Acute embolism and thrombosis of unspecified deep veins of right lower extremity: Secondary | ICD-10-CM | POA: Diagnosis not present

## 2016-08-15 DIAGNOSIS — C712 Malignant neoplasm of temporal lobe: Secondary | ICD-10-CM | POA: Diagnosis not present

## 2016-08-15 DIAGNOSIS — K922 Gastrointestinal hemorrhage, unspecified: Secondary | ICD-10-CM | POA: Diagnosis not present

## 2016-08-16 DIAGNOSIS — I2699 Other pulmonary embolism without acute cor pulmonale: Secondary | ICD-10-CM | POA: Diagnosis not present

## 2016-08-16 DIAGNOSIS — J9621 Acute and chronic respiratory failure with hypoxia: Secondary | ICD-10-CM | POA: Diagnosis not present

## 2016-08-16 DIAGNOSIS — K922 Gastrointestinal hemorrhage, unspecified: Secondary | ICD-10-CM | POA: Diagnosis not present

## 2016-08-16 DIAGNOSIS — C712 Malignant neoplasm of temporal lobe: Secondary | ICD-10-CM | POA: Diagnosis not present

## 2016-08-16 DIAGNOSIS — I82401 Acute embolism and thrombosis of unspecified deep veins of right lower extremity: Secondary | ICD-10-CM | POA: Diagnosis not present

## 2016-08-17 DIAGNOSIS — K922 Gastrointestinal hemorrhage, unspecified: Secondary | ICD-10-CM | POA: Diagnosis not present

## 2016-08-17 DIAGNOSIS — I2699 Other pulmonary embolism without acute cor pulmonale: Secondary | ICD-10-CM | POA: Diagnosis not present

## 2016-08-17 DIAGNOSIS — J9621 Acute and chronic respiratory failure with hypoxia: Secondary | ICD-10-CM | POA: Diagnosis not present

## 2016-08-17 DIAGNOSIS — C712 Malignant neoplasm of temporal lobe: Secondary | ICD-10-CM | POA: Diagnosis not present

## 2016-08-17 DIAGNOSIS — I82401 Acute embolism and thrombosis of unspecified deep veins of right lower extremity: Secondary | ICD-10-CM | POA: Diagnosis not present

## 2016-08-18 DIAGNOSIS — I82401 Acute embolism and thrombosis of unspecified deep veins of right lower extremity: Secondary | ICD-10-CM | POA: Diagnosis not present

## 2016-08-18 DIAGNOSIS — C712 Malignant neoplasm of temporal lobe: Secondary | ICD-10-CM | POA: Diagnosis not present

## 2016-08-18 DIAGNOSIS — J9621 Acute and chronic respiratory failure with hypoxia: Secondary | ICD-10-CM | POA: Diagnosis not present

## 2016-08-18 DIAGNOSIS — I2699 Other pulmonary embolism without acute cor pulmonale: Secondary | ICD-10-CM | POA: Diagnosis not present

## 2016-08-18 DIAGNOSIS — K922 Gastrointestinal hemorrhage, unspecified: Secondary | ICD-10-CM | POA: Diagnosis not present

## 2016-08-19 DIAGNOSIS — C712 Malignant neoplasm of temporal lobe: Secondary | ICD-10-CM | POA: Diagnosis not present

## 2016-08-19 DIAGNOSIS — I82401 Acute embolism and thrombosis of unspecified deep veins of right lower extremity: Secondary | ICD-10-CM | POA: Diagnosis not present

## 2016-08-19 DIAGNOSIS — J9621 Acute and chronic respiratory failure with hypoxia: Secondary | ICD-10-CM | POA: Diagnosis not present

## 2016-08-19 DIAGNOSIS — K922 Gastrointestinal hemorrhage, unspecified: Secondary | ICD-10-CM | POA: Diagnosis not present

## 2016-08-19 DIAGNOSIS — I2699 Other pulmonary embolism without acute cor pulmonale: Secondary | ICD-10-CM | POA: Diagnosis not present

## 2016-08-20 DIAGNOSIS — I82401 Acute embolism and thrombosis of unspecified deep veins of right lower extremity: Secondary | ICD-10-CM | POA: Diagnosis not present

## 2016-08-20 DIAGNOSIS — K922 Gastrointestinal hemorrhage, unspecified: Secondary | ICD-10-CM | POA: Diagnosis not present

## 2016-08-20 DIAGNOSIS — J9621 Acute and chronic respiratory failure with hypoxia: Secondary | ICD-10-CM | POA: Diagnosis not present

## 2016-08-20 DIAGNOSIS — I2699 Other pulmonary embolism without acute cor pulmonale: Secondary | ICD-10-CM | POA: Diagnosis not present

## 2016-08-20 DIAGNOSIS — C712 Malignant neoplasm of temporal lobe: Secondary | ICD-10-CM | POA: Diagnosis not present

## 2016-08-21 DIAGNOSIS — K922 Gastrointestinal hemorrhage, unspecified: Secondary | ICD-10-CM | POA: Diagnosis not present

## 2016-08-21 DIAGNOSIS — I2699 Other pulmonary embolism without acute cor pulmonale: Secondary | ICD-10-CM | POA: Diagnosis not present

## 2016-08-21 DIAGNOSIS — C712 Malignant neoplasm of temporal lobe: Secondary | ICD-10-CM | POA: Diagnosis not present

## 2016-08-21 DIAGNOSIS — J9621 Acute and chronic respiratory failure with hypoxia: Secondary | ICD-10-CM | POA: Diagnosis not present

## 2016-08-21 DIAGNOSIS — I82401 Acute embolism and thrombosis of unspecified deep veins of right lower extremity: Secondary | ICD-10-CM | POA: Diagnosis not present

## 2016-08-22 DIAGNOSIS — J9621 Acute and chronic respiratory failure with hypoxia: Secondary | ICD-10-CM | POA: Diagnosis not present

## 2016-08-22 DIAGNOSIS — K922 Gastrointestinal hemorrhage, unspecified: Secondary | ICD-10-CM | POA: Diagnosis not present

## 2016-08-22 DIAGNOSIS — I82401 Acute embolism and thrombosis of unspecified deep veins of right lower extremity: Secondary | ICD-10-CM | POA: Diagnosis not present

## 2016-08-22 DIAGNOSIS — I2699 Other pulmonary embolism without acute cor pulmonale: Secondary | ICD-10-CM | POA: Diagnosis not present

## 2016-08-22 DIAGNOSIS — C712 Malignant neoplasm of temporal lobe: Secondary | ICD-10-CM | POA: Diagnosis not present

## 2016-08-23 DIAGNOSIS — I2699 Other pulmonary embolism without acute cor pulmonale: Secondary | ICD-10-CM | POA: Diagnosis not present

## 2016-08-23 DIAGNOSIS — C712 Malignant neoplasm of temporal lobe: Secondary | ICD-10-CM | POA: Diagnosis not present

## 2016-08-23 DIAGNOSIS — I82401 Acute embolism and thrombosis of unspecified deep veins of right lower extremity: Secondary | ICD-10-CM | POA: Diagnosis not present

## 2016-08-23 DIAGNOSIS — J9621 Acute and chronic respiratory failure with hypoxia: Secondary | ICD-10-CM | POA: Diagnosis not present

## 2016-08-23 DIAGNOSIS — K922 Gastrointestinal hemorrhage, unspecified: Secondary | ICD-10-CM | POA: Diagnosis not present

## 2016-08-24 DIAGNOSIS — C712 Malignant neoplasm of temporal lobe: Secondary | ICD-10-CM | POA: Diagnosis not present

## 2016-08-24 DIAGNOSIS — I2699 Other pulmonary embolism without acute cor pulmonale: Secondary | ICD-10-CM | POA: Diagnosis not present

## 2016-08-24 DIAGNOSIS — I82401 Acute embolism and thrombosis of unspecified deep veins of right lower extremity: Secondary | ICD-10-CM | POA: Diagnosis not present

## 2016-08-24 DIAGNOSIS — K922 Gastrointestinal hemorrhage, unspecified: Secondary | ICD-10-CM | POA: Diagnosis not present

## 2016-08-24 DIAGNOSIS — J9621 Acute and chronic respiratory failure with hypoxia: Secondary | ICD-10-CM | POA: Diagnosis not present

## 2016-09-04 DEATH — deceased

## 2017-09-24 IMAGING — CT CT ABD-PELV W/ CM
2 of 5 series · 17 of 46 positions shown, 19 images · IV contrast (isovue)
Comparison: None.

CLINICAL DATA: Abdominal pain, weight loss, diarrhea, nausea.
History of thyroid cancer.

EXAM:
CT ABDOMEN AND PELVIS WITH CONTRAST
TECHNIQUE: Multidetector CT imaging of the abdomen and pelvis was performed
using the standard protocol following bolus administration of
intravenous contrast.
CONTRAST:  100 mL Isovue 300 IV

[Series 2: axial soft tissue · axial · 0.78mm/px · z∈[+604,+1004]mm · 14 of 91 slices shown, 16 images]
[im 6/91  soft-tissue]
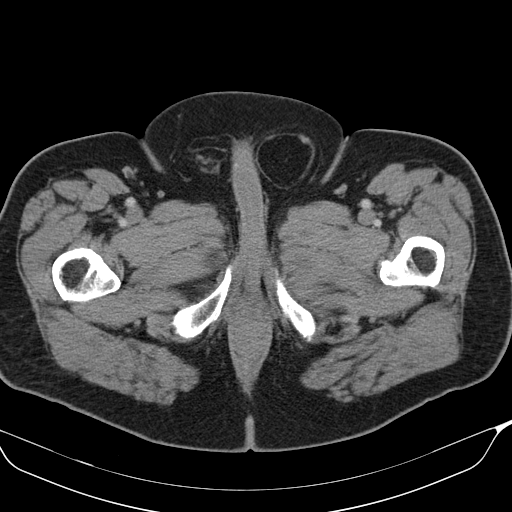
[im 6/91  bone]
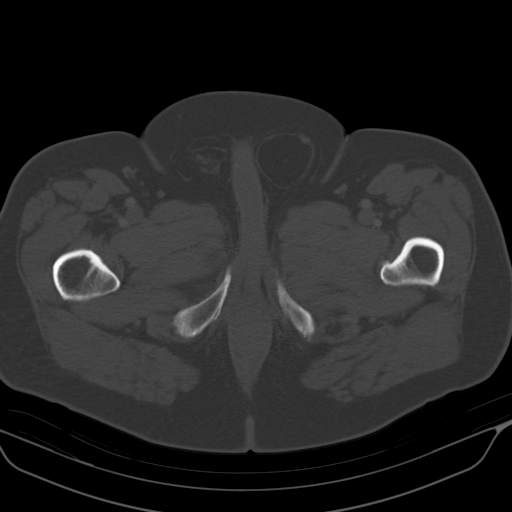
[im 11/91  soft-tissue]
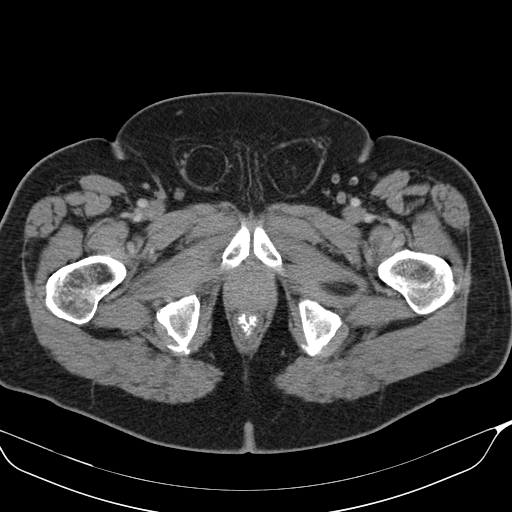
[im 21/91  soft-tissue]
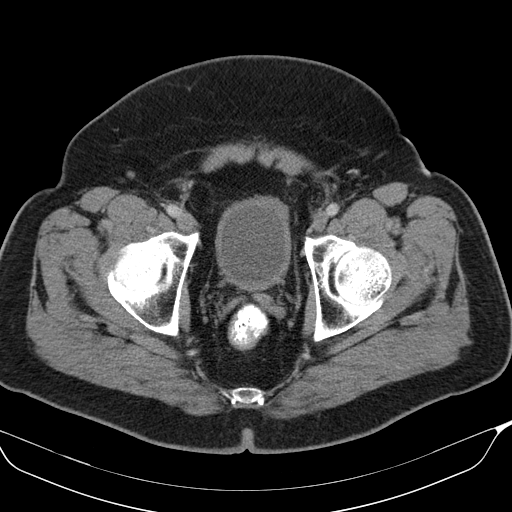
[im 26/91  soft-tissue]
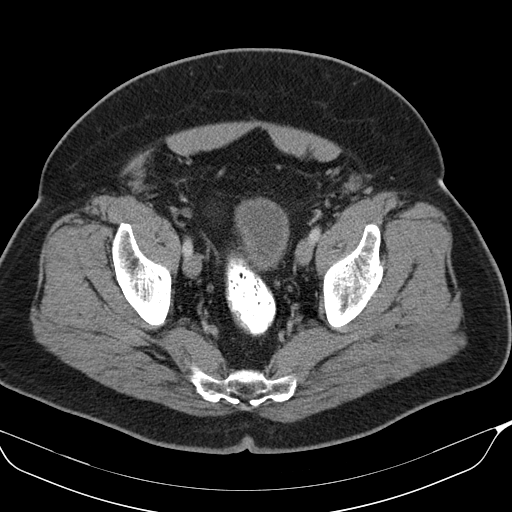
[im 31/91  soft-tissue]
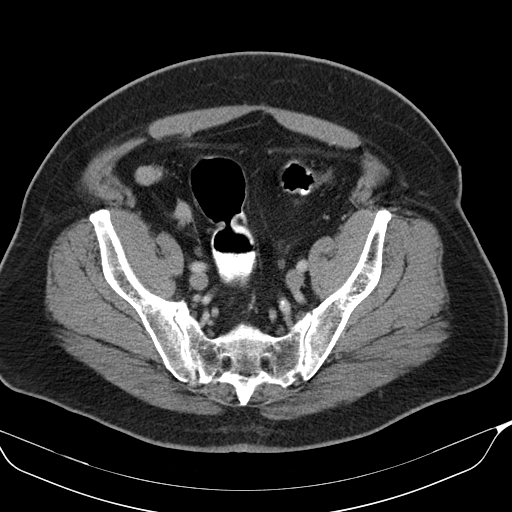
[im 36/91  soft-tissue]
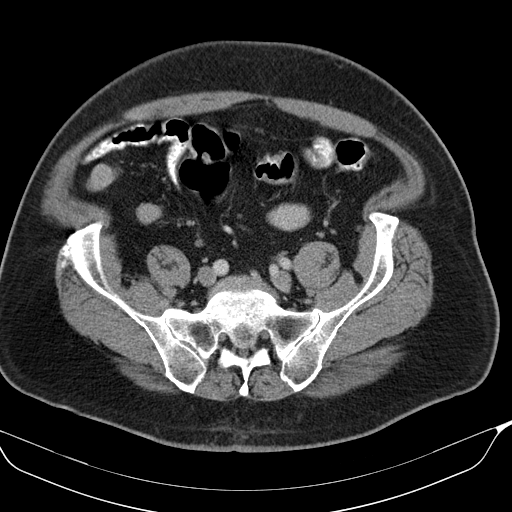
[im 41/91  soft-tissue]
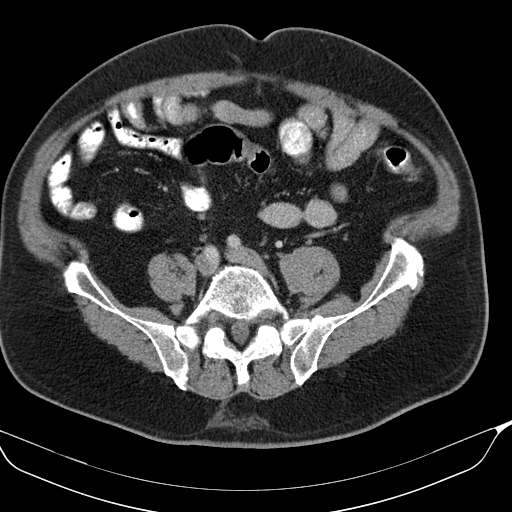
[im 51/91  soft-tissue]
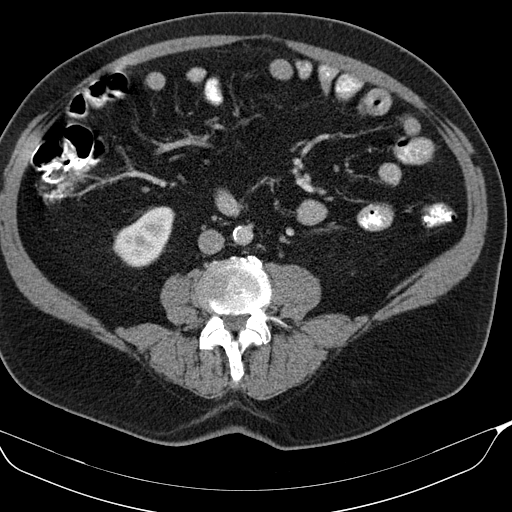
[im 56/91  soft-tissue]
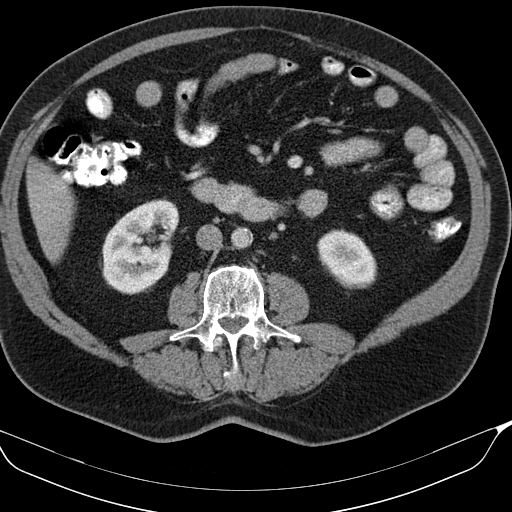
[im 56/91  bone]
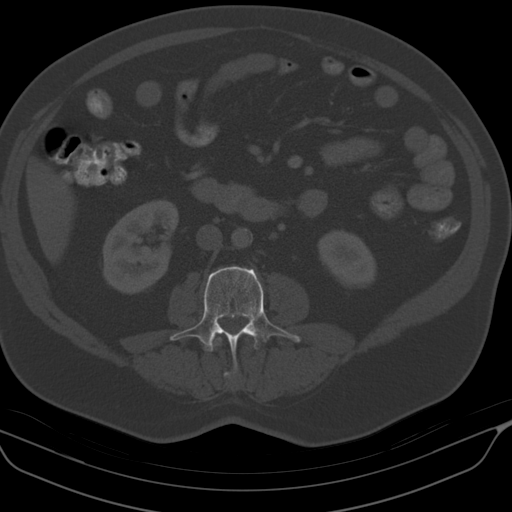
[im 61/91  soft-tissue]
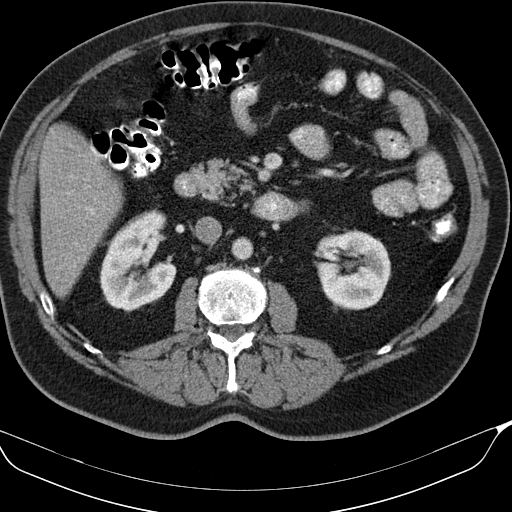
[im 66/91  soft-tissue]
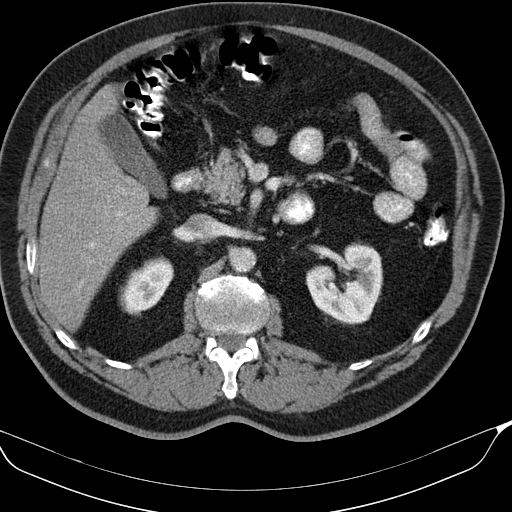
[im 71/91  soft-tissue]
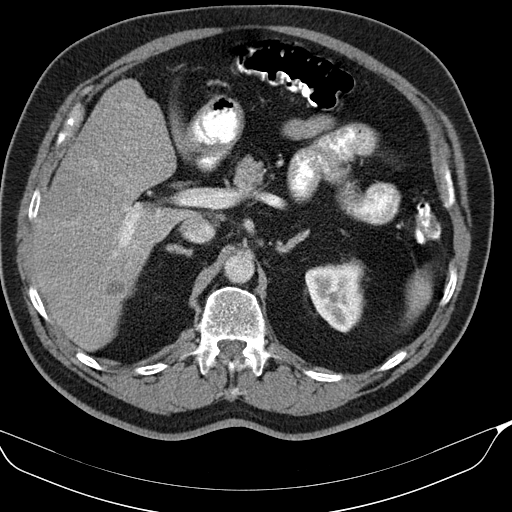
[im 81/91  soft-tissue]
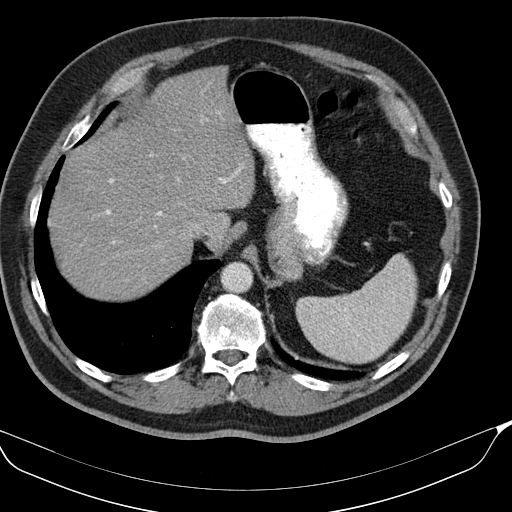
[im 86/91  soft-tissue]
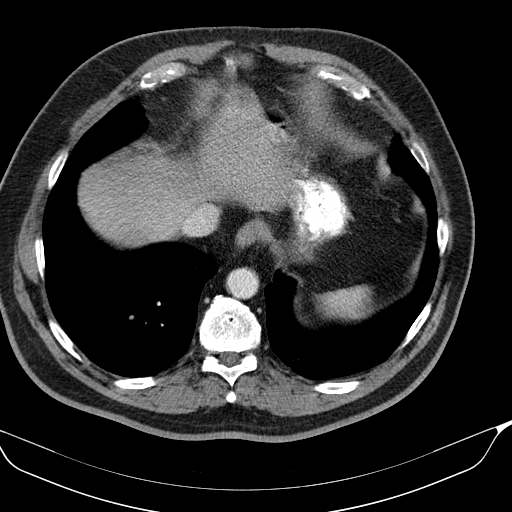

[Series 602: coronal · coronal · 0.88mm/px · 3 of 144 slices shown]
[im 48/144  soft-tissue]
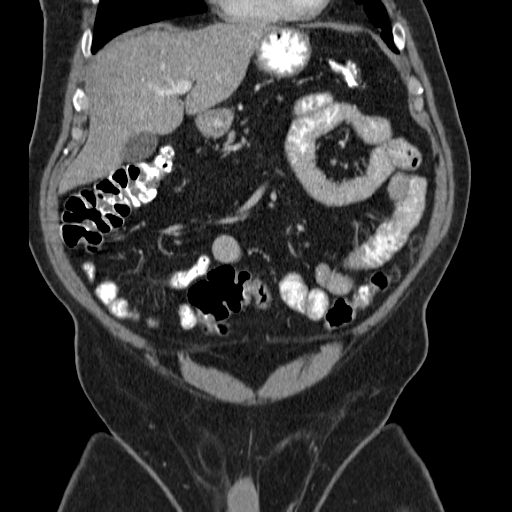
[im 64/144  soft-tissue]
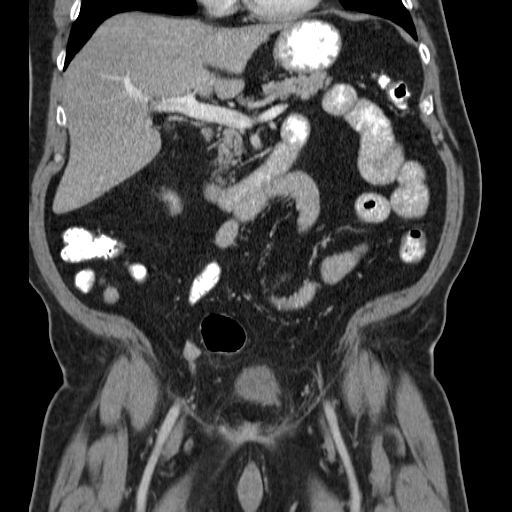
[im 80/144  soft-tissue]
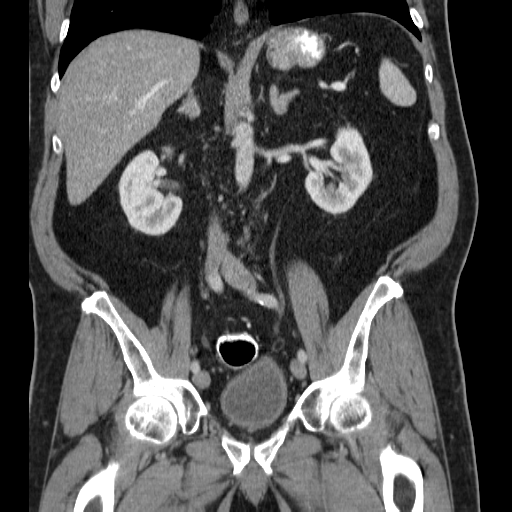

[17 of 46 positions shown; findings below may reference images not displayed]

FINDINGS: Lower chest:  Lung bases are clear.

Hepatobiliary: 1.5 cm probable cyst in the posterior segment right
hepatic lobe (series 2/ image 22).

Gallbladder is unremarkable. No intrahepatic or extrahepatic ductal
dilatation.

Pancreas: Within normal limits.

Spleen: Within normal limits.

Adrenals/Urinary Tract: Adrenal glands are within normal limits.

Kidneys are within normal limits.  No hydronephrosis.

Bladder is mildly thick-walled although underdistended.

Stomach/Bowel: Stomach is within normal limits.

No evidence of bowel obstruction.

Normal appendix.

Mild left colonic diverticulosis.

Two fat density lesions in the left lower quadrant (series 2/ images
44 and 49), possibly reflecting omental infarcts, not acute.

Vascular/Lymphatic: Atherosclerotic calcifications of the abdominal
aorta and branch vessels. No evidence of abdominal aortic aneurysm.

No suspicious abdominopelvic lymphadenopathy.

Reproductive: Prostate is unremarkable.

Other: No abdominopelvic ascites.

Moderate fat containing bilateral inguinal hernias (series 2/image
78), left greater than right.

Musculoskeletal: Degenerative changes of the visualized
thoracolumbar spine.
IMPRESSION: No evidence of bowel obstruction.  Normal appendix.

Moderate fat containing bilateral inguinal hernias.

Additional ancillary findings as above.
